# Patient Record
Sex: Male | Born: 1990 | Race: White | Hispanic: No | Marital: Single | Smoking: Former smoker
Health system: Southern US, Community
[De-identification: ages and names within clinical notes are randomized; demographics above are authoritative.]

## PROBLEM LIST (undated history)

## (undated) HISTORY — PX: WISDOM TOOTH EXTRACTION: SHX21

---

## 2014-03-23 ENCOUNTER — Ambulatory Visit (INDEPENDENT_AMBULATORY_CARE_PROVIDER_SITE_OTHER): Payer: Self-pay | Admitting: Family Medicine

## 2014-03-23 VITALS — BP 119/75 | HR 65 | Temp 98.2°F | Ht 69.0 in | Wt 206.0 lb

## 2014-03-23 DIAGNOSIS — L03012 Cellulitis of left finger: Secondary | ICD-10-CM

## 2014-03-23 MED ORDER — CEFTRIAXONE SODIUM 1 G IJ SOLR: 1.0000 g | Freq: Once | INTRAMUSCULAR | Status: DC

## 2014-03-23 MED ORDER — CEFTRIAXONE SODIUM 1 G IJ SOLR
1.0000 g | Freq: Once | INTRAMUSCULAR | Status: AC
  Administered 2014-03-23: 1 g via INTRAMUSCULAR

## 2014-03-23 MED ORDER — HYDROCODONE-ACETAMINOPHEN 5-325 MG PO TABS: 1.0000 | ORAL_TABLET | Freq: Four times a day (QID) | ORAL | Status: DC | PRN

## 2014-03-23 MED ORDER — LEVOFLOXACIN 500 MG PO TABS: 500.0000 mg | ORAL_TABLET | Freq: Every day | ORAL | Status: DC

## 2014-03-23 NOTE — Progress Notes (Signed)
   Subjective:    Patient ID: Jonathon Wright, male    DOB: January 03, 1991, 23 y.o.   MRN: [id number]  HPI Patient is here for c/o left hand pain and infection.  Patient injured hand and this morning it swelled up and is red and painful.   Review of Systems C/o left hand redness and swelling.   No chest pain, SOB, HA, dizziness, vision change, N/V, diarrhea, constipation, dysuria, urinary urgency or frequency, myalgias, arthralgias or rash.  Objective:   Physical Exam Vital signs noted  Well developed well nourished male.  HEENT - Head atraumatic Normocephalic Respiratory - Lungs CTA bilateral Cardiac - RRR S1 and S2 without murmur GI - Abdomen soft Nontender and bowel sounds active x 4 Extremities - No edema. Neuro - Grossly intact. Skin - Right hand erythematous and right index finer with erythema and wound w/o drainage.      Assessment & Plan:  Cellulitis of finger of left hand - Plan: cefTRIAXone (ROCEPHIN) 1 G injection, levofloxacin (LEVAQUIN) 500 MG tablet, HYDROcodone-acetaminophen (NORCO) 5-325 MG per tablet  Discussed with patient to follow up if not getting better  Note for light duty given.  Deatra CanterWilliam J Monita Swier FNP

## 2014-07-03 ENCOUNTER — Emergency Department (HOSPITAL_COMMUNITY)
Admission: EM | Admit: 2014-07-03 | Discharge: 2014-07-04 | Disposition: A | Discharge status: Home / Self Care | Payer: Self-pay | Attending: Emergency Medicine | Admitting: Emergency Medicine

## 2014-07-03 ENCOUNTER — Encounter (HOSPITAL_COMMUNITY): Payer: Self-pay | Admitting: Emergency Medicine

## 2014-07-03 DIAGNOSIS — Z7952 Long term (current) use of systemic steroids: Secondary | ICD-10-CM | POA: Insufficient documentation

## 2014-07-03 DIAGNOSIS — W57XXXA Bitten or stung by nonvenomous insect and other nonvenomous arthropods, initial encounter: Secondary | ICD-10-CM | POA: Insufficient documentation

## 2014-07-03 DIAGNOSIS — Z79899 Other long term (current) drug therapy: Secondary | ICD-10-CM | POA: Insufficient documentation

## 2014-07-03 DIAGNOSIS — Z720 Tobacco use: Secondary | ICD-10-CM | POA: Insufficient documentation

## 2014-07-03 DIAGNOSIS — L089 Local infection of the skin and subcutaneous tissue, unspecified: Secondary | ICD-10-CM

## 2014-07-03 DIAGNOSIS — Y998 Other external cause status: Secondary | ICD-10-CM | POA: Insufficient documentation

## 2014-07-03 DIAGNOSIS — L0889 Other specified local infections of the skin and subcutaneous tissue: Secondary | ICD-10-CM | POA: Insufficient documentation

## 2014-07-03 DIAGNOSIS — Y9389 Activity, other specified: Secondary | ICD-10-CM | POA: Insufficient documentation

## 2014-07-03 DIAGNOSIS — Z792 Long term (current) use of antibiotics: Secondary | ICD-10-CM | POA: Insufficient documentation

## 2014-07-03 DIAGNOSIS — Y9289 Other specified places as the place of occurrence of the external cause: Secondary | ICD-10-CM | POA: Insufficient documentation

## 2014-07-03 DIAGNOSIS — S60562A Insect bite (nonvenomous) of left hand, initial encounter: Secondary | ICD-10-CM | POA: Insufficient documentation

## 2014-07-03 MED ORDER — DIPHENHYDRAMINE HCL 50 MG/ML IJ SOLN
25.0000 mg | Freq: Once | INTRAMUSCULAR | Status: AC
Start: 2014-07-03 — End: 2014-07-03
  Administered 2014-07-03: 25 mg via INTRAVENOUS
  Filled 2014-07-03: qty 1

## 2014-07-03 MED ORDER — FAMOTIDINE IN NACL 20-0.9 MG/50ML-% IV SOLN
20.0000 mg | Freq: Once | INTRAVENOUS | Status: AC
  Administered 2014-07-03: 20 mg via INTRAVENOUS
  Filled 2014-07-03: qty 50

## 2014-07-03 MED ORDER — METHYLPREDNISOLONE SODIUM SUCC 125 MG IJ SOLR
125.0000 mg | Freq: Once | INTRAMUSCULAR | Status: AC
  Administered 2014-07-03: 125 mg via INTRAVENOUS
  Filled 2014-07-03: qty 2

## 2014-07-03 NOTE — ED Provider Notes (Signed)
CSN: [id number]     Arrival date & time 07/03/14  2258 History   First MD Initiated Contact with Patient 07/03/14 2326     Chief Complaint  Patient presents with  . Joint Swelling     (Consider location/radiation/quality/duration/timing/severity/associated sxs/prior Treatment) Patient is a 24 y.o. male presenting with hand pain. The history is provided by the patient.  Hand Pain This is a new problem. The current episode started today. The problem occurs constantly. The problem has been gradually worsening. Nothing aggravates the symptoms. He has tried nothing for the symptoms.   Jonathon Wright is a 24 y.o. male who presents to the ED with left hand pain and swelling. He thinks he may have been bitten by an insect. He noticed the possible bite area last night. 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 He complains of increased pain to the area of the index finger.   History reviewed. No pertinent past medical history. History reviewed. No pertinent past surgical history. No family history on file. History  Substance Use Topics  . Smoking status: Current Every Day Smoker -- 1.00 packs/day for 8 years    Types: Cigarettes  . Smokeless tobacco: Not on file  . Alcohol Use: 0.0 oz/week    0 Standard drinks or equivalent per week    Review of Systems Negative except as stated in HPI   Allergies  Review of patient's allergies indicates no known allergies.  Home Medications   Prior to Admission medications   Medication Sig Start Date End Date Taking? Authorizing Provider  cephALEXin (KEFLEX) 500 MG capsule Take 1 capsule (500 mg total) by mouth 4 (four) times daily. 07/04/14   Tamlyn Sides Orlene Och, NP  diphenhydrAMINE (BENADRYL) 25 MG tablet Take 1 tablet (25 mg total) by mouth every 6 (six) hours. 07/04/14   Shylie Polo Orlene Och, NP  HYDROcodone-acetaminophen (NORCO) 5-325 MG per tablet Take 1 tablet by mouth every 6 (six) hours as needed for moderate pain. 07/04/14   Truman Aceituno Orlene Och,  NP  levofloxacin (LEVAQUIN) 500 MG tablet Take 1 tablet (500 mg total) by mouth daily. 03/23/14   Deatra Canter, FNP  predniSONE (DELTASONE) 10 MG tablet Take 2 tablets (20 mg total) by mouth 2 (two) times daily with a meal. 07/04/14   Eldred Lievanos Orlene Och, NP  ranitidine (ZANTAC) 150 MG tablet Take 1 tablet (150 mg total) by mouth 2 (two) times daily. 07/04/14   Leshaun Biebel Orlene Och, NP  sulfamethoxazole-trimethoprim (BACTRIM DS,SEPTRA DS) 800-160 MG per tablet Take 1 tablet by mouth 2 (two) times daily. 07/04/14 07/11/14  Arneisha Kincannon Orlene Och, NP   BP 125/74 mmHg  Pulse 83  Temp(Src) 98 F (36.7 C) (Oral)  Resp 18  Ht  (1.753 m)  Wt 197 lb (89.359 kg)  BMI 29.08 kg/m2  SpO2 100% Physical Exam  Constitutional: He is oriented to person, place, and time. He appears well-developed and well-nourished. No distress.  HENT:  Head: Normocephalic and atraumatic.  Eyes: EOM are normal.  Neck: Neck supple.  Cardiovascular: Normal rate.   Pulmonary/Chest: Effort normal.  Musculoskeletal:       Hands: Area of erythema and warmth dorsum of left hand. Tiny area that appears as insect bite.   Neurological: He is alert and oriented to person, place, and time. No cranial nerve deficit.  Skin: Skin is warm and dry.  Psychiatric: He has a normal mood and affect. His behavior is normal.  Nursing note and vitals reviewed.  ED Course  Procedures ( Pepcid 20 mg., Benadryl 25 mg, Solumedrol 125 mg IV, Dilaudid 1 mg, Zofran 4 mg IV After no change in redness Rocephin 1 gram IV and Bactrim DS PO given.   Dr. Lynelle DoctorKnapp in to examine the patient and take picture for the chart. Will continue to treat with oral antibiotics and medications for allergic reaction and patient will return tomorrow for recheck.   MDM  24 y.o. male with cellulitis of left hand. Stable for d/c without neurovascular compromise, no red streaking. He will return tomorrow for recheck or sooner for problems. Will continue to treat with oral antibiotics and  medications for allergic reaction.  Final diagnoses:  Infected insect bite of hand, left, initial encounter      [facility] Kaisha Wachob, NP 07/04/14 [id number]  Devoria AlbeIva Knapp, MD 07/04/14 [phone]

## 2014-07-03 NOTE — ED Notes (Signed)
Patient c/o left hand swelling and redness at proximal joint of index finger; states he thinks he may have been bitten by something.  Left hand red and edematous.

## 2014-07-04 MED ORDER — HYDROMORPHONE HCL 1 MG/ML IJ SOLN
1.0000 mg | Freq: Once | INTRAMUSCULAR | Status: AC
  Administered 2014-07-04: 1 mg via INTRAVENOUS
  Filled 2014-07-04: qty 1

## 2014-07-04 MED ORDER — DIPHENHYDRAMINE HCL 25 MG PO TABS: 25.0000 mg | ORAL_TABLET | Freq: Four times a day (QID) | ORAL | Status: DC

## 2014-07-04 MED ORDER — ONDANSETRON HCL 4 MG/2ML IJ SOLN
4.0000 mg | Freq: Once | INTRAMUSCULAR | Status: AC
Start: 2014-07-04 — End: 2014-07-04
  Administered 2014-07-04: 4 mg via INTRAVENOUS
  Filled 2014-07-04: qty 2

## 2014-07-04 MED ORDER — PREDNISONE 10 MG PO TABS: 20.0000 mg | ORAL_TABLET | Freq: Two times a day (BID) | ORAL | Status: DC

## 2014-07-04 MED ORDER — CEPHALEXIN 500 MG PO CAPS: 500.0000 mg | ORAL_CAPSULE | Freq: Four times a day (QID) | ORAL | Status: DC

## 2014-07-04 MED ORDER — CEFTRIAXONE SODIUM 250 MG IJ SOLR
250.0000 mg | Freq: Once | INTRAMUSCULAR | Status: AC
  Administered 2014-07-04: 250 mg via INTRAMUSCULAR
  Filled 2014-07-04: qty 250

## 2014-07-04 MED ORDER — LIDOCAINE HCL (PF) 1 % IJ SOLN
INTRAMUSCULAR | Status: AC
  Filled 2014-07-04: qty 5

## 2014-07-04 MED ORDER — HYDROCODONE-ACETAMINOPHEN 5-325 MG PO TABS: 1.0000 | ORAL_TABLET | Freq: Four times a day (QID) | ORAL | Status: DC | PRN

## 2014-07-04 MED ORDER — SULFAMETHOXAZOLE-TRIMETHOPRIM 800-160 MG PO TABS: 1.0000 | ORAL_TABLET | Freq: Two times a day (BID) | ORAL | Status: AC

## 2014-07-04 MED ORDER — RANITIDINE HCL 150 MG PO TABS: 150.0000 mg | ORAL_TABLET | Freq: Two times a day (BID) | ORAL | Status: DC

## 2014-07-04 MED ORDER — SULFAMETHOXAZOLE-TRIMETHOPRIM 800-160 MG PO TABS
1.0000 | ORAL_TABLET | Freq: Once | ORAL | Status: AC
  Administered 2014-07-04: 1 via ORAL
  Filled 2014-07-04: qty 1

## 2014-07-04 NOTE — ED Notes (Signed)
Redness to the left hand marked and informed pt if area gets outside of lines he needs to be reevaluated.

## 2014-07-04 NOTE — ED Provider Notes (Signed)
Pt states he didn't do anything on 4/9 but noticed a bite site near the MCP of his LIF when he went to bed. 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      Pt has an apparent sting site on the ulnar aspect of his LIF MCPJ with mild diffuse redness and swelling of the dorsum of his left hand as shown.        Medical screening examination/treatment/procedure(s) were conducted as a shared visit with non-physician practitioner(s) and myself.  I personally evaluated the patient during the encounter.   EKG Interpretation None       Devoria AlbeIva [place], MD, Concha PyoFACEP   Kamrin Spath, MD 07/04/14 [phone]

## 2014-07-06 ENCOUNTER — Encounter (HOSPITAL_COMMUNITY): Payer: Self-pay | Admitting: *Deleted

## 2014-07-06 ENCOUNTER — Emergency Department (HOSPITAL_COMMUNITY)
Admission: EM | Admit: 2014-07-06 | Discharge: 2014-07-06 | Disposition: A | Discharge status: Home / Self Care | Payer: Self-pay | Attending: Emergency Medicine | Admitting: Emergency Medicine

## 2014-07-06 DIAGNOSIS — Z7952 Long term (current) use of systemic steroids: Secondary | ICD-10-CM | POA: Insufficient documentation

## 2014-07-06 DIAGNOSIS — Z792 Long term (current) use of antibiotics: Secondary | ICD-10-CM | POA: Insufficient documentation

## 2014-07-06 DIAGNOSIS — S60562D Insect bite (nonvenomous) of left hand, subsequent encounter: Secondary | ICD-10-CM | POA: Insufficient documentation

## 2014-07-06 DIAGNOSIS — W57XXXD Bitten or stung by nonvenomous insect and other nonvenomous arthropods, subsequent encounter: Secondary | ICD-10-CM | POA: Insufficient documentation

## 2014-07-06 DIAGNOSIS — W57XXXA Bitten or stung by nonvenomous insect and other nonvenomous arthropods, initial encounter: Secondary | ICD-10-CM

## 2014-07-06 DIAGNOSIS — L089 Local infection of the skin and subcutaneous tissue, unspecified: Secondary | ICD-10-CM | POA: Insufficient documentation

## 2014-07-06 DIAGNOSIS — Z720 Tobacco use: Secondary | ICD-10-CM | POA: Insufficient documentation

## 2014-07-06 MED ORDER — OXYCODONE-ACETAMINOPHEN 5-325 MG PO TABS: 1.0000 | ORAL_TABLET | ORAL | Status: DC | PRN

## 2014-07-06 NOTE — Discharge Instructions (Signed)
Insect Bite Mosquitoes, flies, fleas, bedbugs, and other insects can bite. Insect bites are different from insect stings. The bite may be red, puffy (swollen), and itchy for 2 to 4 days. Most bites get better on their own. HOME CARE   Do not scratch the bite.  Keep the bite clean and dry. Wash the bite with soap and water.  Put ice on the bite.  Put ice in a plastic bag.  Place a towel between your skin and the bag.  Leave the ice on for 20 minutes, 4 times a day. Do this for the first 2 to 3 days, or as told by your doctor.  You may use medicated lotions or creams to lessen itching as told by your doctor.  Only take medicines as told by your doctor.  If you are given medicines (antibiotics), take them as told. Finish them even if you start to feel better. You may need a tetanus shot if:  You cannot remember when you had your last tetanus shot.  You have never had a tetanus shot.  The injury broke your skin. If you need a tetanus shot and you choose not to have one, you may get tetanus. Sickness from tetanus can be serious. GET HELP RIGHT AWAY IF:   You have more pain, redness, or puffiness.  You see a red line on the skin coming from the bite.  You have a fever.  You have joint pain.  You have a headache or neck pain.  You feel weak.  You have a rash.  You have chest pain, or you are short of breath.  You have belly (abdominal) pain.  You feel sick to your stomach (nauseous) or throw up (vomit).  You feel very tired or sleepy. MAKE SURE YOU:   Understand these instructions.  Will watch your condition.  Will get help right away if you are not doing well or get worse. Document Released: 03/08/2000 Document Revised: 06/03/2011 Document Reviewed: 10/10/2010 Exit[employer] Patient Information 2015 [employer]. This information is not intended to replace advice given to you by your health care provider. Make sure you discuss any questions you have with your health  care provider.  

## 2014-07-06 NOTE — ED Notes (Signed)
Seen here 4 /10 for redness and swelling of lt hand,  Was supposed to return 4/11, but did not Here for recheck , says it cont to hurt

## 2014-07-06 NOTE — ED Notes (Signed)
Pt verbalized understanding of no driving and to use caution within 4 hours of taking pain meds due to meds cause drowsiness 

## 2014-07-07 NOTE — ED Provider Notes (Signed)
CSN: [id number]     Arrival date & time 07/06/14  1106 History   First MD Initiated Contact with Patient 07/06/14 1205     Chief Complaint  Patient presents with  . Hand Problem     (Consider location/radiation/quality/duration/timing/severity/associated sxs/prior Treatment) HPI   Jonathon Wright is a 24 y.o. male who presents to the Emergency Department requesting recheck of infected insect bite to the left hand.  He was seen here on 4/10 and treated with antibiotics.  He states the pain and redness to the area has improved, but reports continued swelling.  He states he continues to have mobility of his fingers,  He denies fever, chills, radiating pain, numbness or swelling to his fingers.     History reviewed. No pertinent past medical history. History reviewed. No pertinent past surgical history. History reviewed. No pertinent family history. History  Substance Use Topics  . Smoking status: Current Every Day Smoker -- 1.00 packs/day for 8 years    Types: Cigarettes  . Smokeless tobacco: Not on file  . Alcohol Use: 0.0 oz/week    0 Standard drinks or equivalent per week    Review of Systems  Constitutional: Negative for fever and chills.  Gastrointestinal: Negative for nausea and vomiting.  Musculoskeletal: Positive for myalgias. Negative for joint swelling and arthralgias.  Skin: Negative for color change and wound.       Insect bite to left hand  Neurological: Negative for dizziness, weakness and numbness.  All other systems reviewed and are negative.     Allergies  Review of patient's allergies indicates no known allergies.  Home Medications   Prior to Admission medications   Medication Sig Start Date End Date Taking? Authorizing Provider  cephALEXin (KEFLEX) 500 MG capsule Take 1 capsule (500 mg total) by mouth 4 (four) times daily. 07/04/14  Yes Hope Orlene Och, NP  diphenhydrAMINE (BENADRYL) 25 MG tablet Take 1 tablet (25 mg total) by mouth every 6 (six) hours.  07/04/14  Yes Hope Orlene Och, NP  HYDROcodone-acetaminophen (NORCO) 5-325 MG per tablet Take 1 tablet by mouth every 6 (six) hours as needed for moderate pain. 07/04/14  Yes Hope Orlene Och, NP  predniSONE (DELTASONE) 10 MG tablet Take 2 tablets (20 mg total) by mouth 2 (two) times daily with a meal. 07/04/14  Yes Hope Orlene Och, NP  ranitidine (ZANTAC) 150 MG tablet Take 1 tablet (150 mg total) by mouth 2 (two) times daily. 07/04/14  Yes Hope Orlene Och, NP  sulfamethoxazole-trimethoprim (BACTRIM DS,SEPTRA DS) 800-160 MG per tablet Take 1 tablet by mouth 2 (two) times daily. 07/04/14 07/11/14 Yes Hope Orlene Och, NP  levofloxacin (LEVAQUIN) 500 MG tablet Take 1 tablet (500 mg total) by mouth daily. Patient not taking: Reported on 07/06/2014 03/23/14   Deatra Canter, FNP  oxyCODONE-acetaminophen (PERCOCET/ROXICET) 5-325 MG per tablet Take 1 tablet by mouth every 4 (four) hours as needed. 07/06/14   Kayleen Alig, PA-C   BP 119/73 mmHg  Pulse 58  Temp(Src) 98.4 F (36.9 C) (Oral)  Resp 18  Ht  (1.753 m)  Wt 197 lb (89.359 kg)  BMI 29.08 kg/m2  SpO2 99% Physical Exam  Constitutional: He is oriented to person, place, and time. He appears well-developed and well-nourished. No distress.  HENT:  Head: Normocephalic and atraumatic.  Neck: Normal range of motion. Neck supple.  Cardiovascular: Normal rate, regular rhythm and intact distal pulses.   No murmur heard. Pulmonary/Chest: Effort normal and breath sounds normal. No respiratory distress.  Musculoskeletal: He exhibits edema and tenderness.  Neurological: He is alert and oriented to person, place, and time. He exhibits normal muscle tone. Coordination normal.  Skin: Skin is warm and dry.  Mild edema of the dorsal left hand.  Erythema improved.  Small crusted papule to the dorsal aspect of the hand, near the second metacarpal head.  No fluctuance or drainage.  Psychiatric: He has a normal mood and affect.  Nursing note and vitals reviewed.   ED  Course  Procedures (including critical care time) Labs Review Labs Reviewed - No data to display  Imaging Review No results found.   EKG Interpretation None      MDM   Final diagnoses:  Infected insect bite   Pt was seen here on 07/03/14 and treated for infected insect bite.  Leading edge of the erythema was previously marked and on this visit seems to be improving,  He is NV intact, has full ROM of the digits and is currently taking antibiotics,  No abscess,  Agrees to continue current regimen and advised to return if worsening sx's develop.      Rosey Bathammy Innocence Schlotzhauer, PA-C 07/07/14 2155  Raeford RazorStephen Kohut, MD 07/08/14 1630

## 2014-09-20 ENCOUNTER — Encounter (HOSPITAL_COMMUNITY): Payer: Self-pay | Admitting: Emergency Medicine

## 2014-09-20 ENCOUNTER — Emergency Department (HOSPITAL_COMMUNITY)
Admission: EM | Admit: 2014-09-20 | Discharge: 2014-09-20 | Disposition: A | Discharge status: Home / Self Care | Payer: Self-pay | Attending: Emergency Medicine | Admitting: Emergency Medicine

## 2014-09-20 DIAGNOSIS — Y9389 Activity, other specified: Secondary | ICD-10-CM | POA: Insufficient documentation

## 2014-09-20 DIAGNOSIS — X58XXXA Exposure to other specified factors, initial encounter: Secondary | ICD-10-CM | POA: Insufficient documentation

## 2014-09-20 DIAGNOSIS — Z792 Long term (current) use of antibiotics: Secondary | ICD-10-CM | POA: Insufficient documentation

## 2014-09-20 DIAGNOSIS — Z79899 Other long term (current) drug therapy: Secondary | ICD-10-CM | POA: Insufficient documentation

## 2014-09-20 DIAGNOSIS — Z7952 Long term (current) use of systemic steroids: Secondary | ICD-10-CM | POA: Insufficient documentation

## 2014-09-20 DIAGNOSIS — Y998 Other external cause status: Secondary | ICD-10-CM | POA: Insufficient documentation

## 2014-09-20 DIAGNOSIS — S39012A Strain of muscle, fascia and tendon of lower back, initial encounter: Secondary | ICD-10-CM | POA: Insufficient documentation

## 2014-09-20 DIAGNOSIS — Y9289 Other specified places as the place of occurrence of the external cause: Secondary | ICD-10-CM | POA: Insufficient documentation

## 2014-09-20 DIAGNOSIS — Z720 Tobacco use: Secondary | ICD-10-CM | POA: Insufficient documentation

## 2014-09-20 MED ORDER — METHOCARBAMOL 500 MG PO TABS: 1000.0000 mg | ORAL_TABLET | Freq: Three times a day (TID) | ORAL | Status: DC

## 2014-09-20 MED ORDER — IBUPROFEN 800 MG PO TABS: 800.0000 mg | ORAL_TABLET | Freq: Three times a day (TID) | ORAL | Status: DC

## 2014-09-20 MED ORDER — DEXAMETHASONE 4 MG PO TABS: 4.0000 mg | ORAL_TABLET | Freq: Two times a day (BID) | ORAL | Status: DC

## 2014-09-20 NOTE — ED Notes (Signed)
Pt reports low back pain that began on Sunday after splitting logs on Sat. Pt reports radiation of symptoms down his R leg.

## 2014-09-20 NOTE — ED Provider Notes (Signed)
CSN: [id number]     Arrival date & time 09/20/14  0818 History   First MD Initiated Contact with Patient 09/20/14 [phone]     Chief Complaint  Patient presents with  . Back Pain     (Consider location/radiation/quality/duration/timing/severity/associated sxs/prior Treatment) Patient is a 24 y.o. male presenting with back pain. The history is provided by the patient.  Back Pain Location:  Lumbar spine Quality:  Aching and stiffness Stiffness is present:  All day Radiates to:  R thigh Pain severity:  Moderate Pain is:  Same all the time Onset quality:  Gradual Duration:  3 days Timing:  Intermittent Progression:  Worsening Chronicity:  New Context: lifting heavy objects and physical stress   Context comment:  Splitting logs Relieved by:  Nothing Worsened by:  Movement Ineffective treatments:  None tried Associated symptoms: no bladder incontinence, no bowel incontinence, no paresthesias, no perianal numbness and no weakness   Risk factors: no recent surgery     History reviewed. No pertinent past medical history. History reviewed. No pertinent past surgical history. History reviewed. No pertinent family history. History  Substance Use Topics  . Smoking status: Current Every Day Smoker -- 1.00 packs/day for 8 years    Types: Cigarettes  . Smokeless tobacco: Not on file  . Alcohol Use: 0.0 oz/week    0 Standard drinks or equivalent per week    Review of Systems  Gastrointestinal: Negative for bowel incontinence.  Genitourinary: Negative for bladder incontinence.  Musculoskeletal: Positive for back pain.  Neurological: Negative for weakness and paresthesias.  All other systems reviewed and are negative.     Allergies  Review of patient's allergies indicates no known allergies.  Home Medications   Prior to Admission medications   Medication Sig Start Date End Date Taking? Authorizing Provider  cephALEXin (KEFLEX) 500 MG capsule Take 1 capsule (500 mg total) by  mouth 4 (four) times daily. 07/04/14   Hope Orlene OchM Neese, NP  diphenhydrAMINE (BENADRYL) 25 MG tablet Take 1 tablet (25 mg total) by mouth every 6 (six) hours. 07/04/14   Hope Orlene OchM Neese, NP  HYDROcodone-acetaminophen (NORCO) 5-325 MG per tablet Take 1 tablet by mouth every 6 (six) hours as needed for moderate pain. 07/04/14   Hope Orlene OchM Neese, NP  levofloxacin (LEVAQUIN) 500 MG tablet Take 1 tablet (500 mg total) by mouth daily. Patient not taking: Reported on 07/06/2014 03/23/14   Deatra CanterWilliam J Oxford, FNP  oxyCODONE-acetaminophen (PERCOCET/ROXICET) 5-325 MG per tablet Take 1 tablet by mouth every 4 (four) hours as needed. 07/06/14   Tammy Triplett, PA-C  predniSONE (DELTASONE) 10 MG tablet Take 2 tablets (20 mg total) by mouth 2 (two) times daily with a meal. 07/04/14   Hope Orlene OchM Neese, NP  ranitidine (ZANTAC) 150 MG tablet Take 1 tablet (150 mg total) by mouth 2 (two) times daily. 07/04/14   Hope Orlene OchM Neese, NP   BP 127/79 mmHg  Pulse 66  Temp(Src) 98.2 F (36.8 C) (Oral)  Resp 18  Ht 5\' 8"  (1.727 m)  Wt 190 lb (86.183 kg)  BMI 28.90 kg/m2  SpO2 100% Physical Exam  Constitutional: He is oriented to person, place, and time. He appears well-developed and well-nourished.  Non-toxic appearance.  HENT:  Head: Normocephalic.  Right Ear: Tympanic membrane and external ear normal.  Left Ear: Tympanic membrane and external ear normal.  Eyes: EOM and lids are normal. Pupils are equal, round, and reactive to light.  Neck: Normal range of motion. Neck supple. Carotid bruit is not present.  Cardiovascular: Normal rate, regular rhythm, normal heart sounds, intact distal pulses and normal pulses.   Pulmonary/Chest: Breath sounds normal. No respiratory distress.  Abdominal: Soft. Bowel sounds are normal. There is no tenderness. There is no guarding.  Musculoskeletal:       Lumbar back: He exhibits decreased range of motion, tenderness and pain. He exhibits no deformity.       Back:  Lymphadenopathy:       Head (right  side): No submandibular adenopathy present.       Head (left side): No submandibular adenopathy present.    He has no cervical adenopathy.  Neurological: He is alert and oriented to person, place, and time. He has normal strength. No cranial nerve deficit or sensory deficit.  Gait steady. No foot drop. No sensory deficit. No motor deficit.  Skin: Skin is warm and dry.  Psychiatric: He has a normal mood and affect. His speech is normal.  Nursing note and vitals reviewed.   ED Course  Procedures (including critical care time) Labs Review Labs Reviewed - No data to display  Imaging Review No results found.   EKG Interpretation None      MDM  Vital signs stable. Exam suggest lumbar strain. No gross neuro deficit noted. No evidence for cauda equina. Pt to use heating pad, rest his back, use decadron, ibuprofen, and robaxin as prescribed. Pt to follow up with Dr Shon Baton for orthopedic eval of back pain if not improving.   Final diagnoses:  None    *I have reviewed nursing notes, vital signs, and all appropriate lab and imaging results for this patient.[address] PA-C 09/20/14 [id number]  Vanetta Mulders, MD 09/20/14 1630

## 2014-09-20 NOTE — Discharge Instructions (Signed)
Heating pad to the lower back may be helpful. Please rest your back as much as possible. Use robaxin and ibuprofen three times daily with food. Use decadron 2 times daily until all taken. Please see Dr Shon BatonBrooks for additional evaluation if not improving. Lumbosacral Strain Lumbosacral strain is a strain of any of the parts that make up your lumbosacral vertebrae. Your lumbosacral vertebrae are the bones that make up the lower third of your backbone. Your lumbosacral vertebrae are held together by muscles and tough, fibrous tissue (ligaments).  CAUSES  A sudden blow to your back can cause lumbosacral strain. Also, anything that causes an excessive stretch of the muscles in the low back can cause this strain. This is typically seen when people exert themselves strenuously, fall, lift heavy objects, bend, or crouch repeatedly. RISK FACTORS  Physically demanding work.  Participation in pushing or pulling sports or sports that require a sudden twist of the back (tennis, golf, baseball).  Weight lifting.  Excessive lower back curvature.  Forward-tilted pelvis.  Weak back or abdominal muscles or both.  Tight hamstrings. SIGNS AND SYMPTOMS  Lumbosacral strain may cause pain in the area of your injury or pain that moves (radiates) down your leg.  DIAGNOSIS Your health care provider can often diagnose lumbosacral strain through a physical exam. In some cases, you may need tests such as X-ray exams.  TREATMENT  Treatment for your lower back injury depends on many factors that your clinician will have to evaluate. However, most treatment will include the use of anti-inflammatory medicines. HOME CARE INSTRUCTIONS   Avoid hard physical activities (tennis, racquetball, waterskiing) if you are not in proper physical condition for it. This may aggravate or create problems.  If you have a back problem, avoid sports requiring sudden body movements. Swimming and walking are generally safer  activities.  Maintain good posture.  Maintain a healthy weight.  For acute conditions, you may put ice on the injured area.  Put ice in a plastic bag.  Place a towel between your skin and the bag.  Leave the ice on for 20 minutes, 2-3 times a day.  When the low back starts healing, stretching and strengthening exercises may be recommended. SEEK MEDICAL CARE IF:  Your back pain is getting worse.  You experience severe back pain not relieved with medicines. SEEK IMMEDIATE MEDICAL CARE IF:   You have numbness, tingling, weakness, or problems with the use of your arms or legs.  There is a change in bowel or bladder control.  You have increasing pain in any area of the body, including your belly (abdomen).  You notice shortness of breath, dizziness, or feel faint.  You feel sick to your stomach (nauseous), are throwing up (vomiting), or become sweaty.  You notice discoloration of your toes or legs, or your feet get very cold. MAKE SURE YOU:   Understand these instructions.  Will watch your condition.  Will get help right away if you are not doing well or get worse. Document Released: 12/19/2004 Document Revised: 03/16/2013 Document Reviewed: 10/28/2012 [facility] Patient Information 2015 [employer], [place]. This information is not intended to replace advice given to you by your health care provider. Make sure you discuss any questions you have with your health care provider.

## 2014-11-14 ENCOUNTER — Emergency Department (HOSPITAL_COMMUNITY)
Admission: EM | Admit: 2014-11-14 | Discharge: 2014-11-14 | Disposition: A | Discharge status: Home / Self Care | Payer: Self-pay | Attending: Emergency Medicine | Admitting: Emergency Medicine

## 2014-11-14 ENCOUNTER — Encounter (HOSPITAL_COMMUNITY): Payer: Self-pay | Admitting: *Deleted

## 2014-11-14 DIAGNOSIS — K047 Periapical abscess without sinus: Secondary | ICD-10-CM | POA: Insufficient documentation

## 2014-11-14 DIAGNOSIS — Z720 Tobacco use: Secondary | ICD-10-CM | POA: Insufficient documentation

## 2014-11-14 DIAGNOSIS — K029 Dental caries, unspecified: Secondary | ICD-10-CM | POA: Insufficient documentation

## 2014-11-14 MED ORDER — TRAMADOL HCL 50 MG PO TABS: 50.0000 mg | ORAL_TABLET | Freq: Four times a day (QID) | ORAL | Status: DC | PRN

## 2014-11-14 MED ORDER — CLINDAMYCIN HCL 150 MG PO CAPS: 300.0000 mg | ORAL_CAPSULE | Freq: Four times a day (QID) | ORAL | Status: DC

## 2014-11-14 NOTE — Discharge Instructions (Signed)
Periodontal Disease °Periodontal disease, or gum disease, is a type of oral disease that affects the surrounding and supporting tissues of the teeth. These include the gums (gingivae), ligaments, and tooth socket (alveolar bone). Periodontal disease can affect one tooth or many teeth. If left untreated, it may lead to tooth loss.  °CAUSES °The main cause of periodontal disease is dental plaque, which contains harmful bacteria. These bacteria can cause the gums to become inflamed and infected. Further progression of the disease can damage the other supporting tissues.  °RISK FACTORS °· Diabetes.   °· Smoking and tobacco use.   °· Genetics.   °· Hormonal changes of puberty, menopause, and pregnancy.   °· Stress.   °· Clenching or grinding your teeth.   °· Substance abuse. °· Poor nutrition.   °· Diseases that interfere with the body's immune system.   °· Certain medicines. °SIGNS AND SYMPTOMS °· Red or swollen gums. °· Bad breath that does not go away. °· Gums that have pulled away from the teeth. °· Gums that bleed easily. °· Permanent teeth that are loose or separating. °· Pain when chewing. °· Changes in the way your teeth fit together. °· Sensitive teeth. °DIAGNOSIS  °A thorough examination of the periodontal tissues will be done by your dentist. X-rays may be needed. Evaluation of your medical history will be needed to see if there are other factors or underlying conditions that may contribute to the disease. °TREATMENT °The number and types of treatment will vary depending on the extent of the disease. Treatment may include brushing and flossing only. Further disease progression may necessitate scaling and root planing or even surgery. The main goal is to control the infection. Good oral hygiene at home is necessary for the success of all types of treatment. °HOME CARE INSTRUCTIONS  °· Practice good oral hygiene. This includes flossing and brushing your teeth every day.   °· See your dentist regularly, at least  2 times per year.   °· Stop smoking if you smoke. °· Eat a well-balanced diet. °SEEK IMMEDIATE DENTAL CARE IF:  °· You have any signs or symptoms of periodontal disease along with: °¨ Swelling of your face, neck, or jaw. °¨ Inability to open your mouth. °¨ Severe pain uncontrolled by pain medicine. °· You have a fever or persistent symptoms for more than 2-3 days. °· You have a fever and your symptoms suddenly get worse. °Document Released: 03/14/2003 Document Revised: 11/11/2012 Document Reviewed: 08/18/2012 °[employer]® Patient Information ©2015 [employer]. This information is not intended to replace advice given to you by your health care provider. Make sure you discuss any questions you have with your health care provider. ° °

## 2014-11-14 NOTE — ED Provider Notes (Signed)
CSN: [id number]     Arrival date & time 11/14/14  4098 History   First MD Initiated Contact with Patient 11/14/14 1004     Chief Complaint  Patient presents with  . Facial Swelling     (Consider location/radiation/quality/duration/timing/severity/associated sxs/prior Treatment) HPI   Jonathon Wright is a 24 y.o. male who presents to the Emergency Department complaining of dental pain, and feeling as though his upper lip and bottom of his nose is swelling. He states he woke up this morning with the symptoms. Dental pain has been progressing for several days. He has not taken any medications for symptoms. He denies difficulty swallowing or breathing, shortness of breath, swelling of his tongue, fever, sore throat or pain to his face.  Pain is worse with chewing.   History reviewed. No pertinent past medical history. History reviewed. No pertinent past surgical history. History reviewed. No pertinent family history. Social History  Substance Use Topics  . Smoking status: Current Every Day Smoker -- 1.00 packs/day for 8 years    Types: Cigarettes  . Smokeless tobacco: None  . Alcohol Use: 0.0 oz/week    0 Standard drinks or equivalent per week    Review of Systems  Constitutional: Negative for fever and appetite change.  HENT: Positive for dental problem and facial swelling. Negative for congestion, sore throat and trouble swallowing.   Eyes: Negative for pain and visual disturbance.  Respiratory: Negative for shortness of breath.   Musculoskeletal: Negative for neck pain and neck stiffness.  Neurological: Negative for dizziness, facial asymmetry and headaches.  Hematological: Negative for adenopathy.  All other systems reviewed and are negative.     Allergies  Review of patient's allergies indicates no known allergies.  Home Medications   Prior to Admission medications   Medication Sig Start Date End Date Taking? Authorizing Provider  cephALEXin (KEFLEX) 500 MG capsule Take  1 capsule (500 mg total) by mouth 4 (four) times daily. Patient not taking: Reported on 11/14/2014 07/04/14   Janne Napoleon, NP  dexamethasone (DECADRON) 4 MG tablet Take 1 tablet (4 mg total) by mouth 2 (two) times daily with a meal. Patient not taking: Reported on 11/14/2014 09/20/14   Ivery Quale, PA-C  diphenhydrAMINE (BENADRYL) 25 MG tablet Take 1 tablet (25 mg total) by mouth every 6 (six) hours. Patient not taking: Reported on 11/14/2014 07/04/14   Janne Napoleon, NP  HYDROcodone-acetaminophen (NORCO) 5-325 MG per tablet Take 1 tablet by mouth every 6 (six) hours as needed for moderate pain. Patient not taking: Reported on 11/14/2014 07/04/14   Janne Napoleon, NP  ibuprofen (ADVIL,MOTRIN) 800 MG tablet Take 1 tablet (800 mg total) by mouth 3 (three) times daily. Patient not taking: Reported on 11/14/2014 09/20/14   Ivery Quale, PA-C  levofloxacin (LEVAQUIN) 500 MG tablet Take 1 tablet (500 mg total) by mouth daily. Patient not taking: Reported on 07/06/2014 03/23/14   Deatra Canter, FNP  methocarbamol (ROBAXIN) 500 MG tablet Take 2 tablets (1,000 mg total) by mouth 3 (three) times daily. Patient not taking: Reported on 11/14/2014 09/20/14   Ivery Quale, PA-C  oxyCODONE-acetaminophen (PERCOCET/ROXICET) 5-325 MG per tablet Take 1 tablet by mouth every 4 (four) hours as needed. Patient not taking: Reported on 11/14/2014 07/06/14   Davione Lenker, PA-C  predniSONE (DELTASONE) 10 MG tablet Take 2 tablets (20 mg total) by mouth 2 (two) times daily with a meal. Patient not taking: Reported on 11/14/2014 07/04/14   Janne Napoleon, NP  ranitidine (ZANTAC) 150  MG tablet Take 1 tablet (150 mg total) by mouth 2 (two) times daily. Patient not taking: Reported on 11/14/2014 07/04/14   Janne Napoleon, NP   BP 114/79 mmHg  Pulse 59  Temp(Src) 98.4 F (36.9 C) (Oral)  Resp 18  SpO2 100% Physical Exam  Constitutional: He is oriented to person, place, and time. He appears well-developed and well-nourished. No distress.   HENT:  Head: Normocephalic and atraumatic.  Right Ear: Tympanic membrane and ear canal normal.  Left Ear: Tympanic membrane and ear canal normal.  Mouth/Throat: Uvula is midline, oropharynx is clear and moist and mucous membranes are normal. No oral lesions. No trismus in the jaw. Dental caries present. No dental abscesses or uvula swelling.  Multiple dental caries with tenderness of the upper central and lateral incisors, mild swelling of the adjacent gums..  No facial swelling, obvious dental abscess, trismus, or sublingual abnml.    Neck: Normal range of motion. Neck supple.  Cardiovascular: Normal rate, regular rhythm and normal heart sounds.   No murmur heard. Pulmonary/Chest: Effort normal and breath sounds normal. No respiratory distress.  Musculoskeletal: Normal range of motion.  Lymphadenopathy:    He has no cervical adenopathy.  Neurological: He is alert and oriented to person, place, and time. He exhibits normal muscle tone. Coordination normal.  Skin: Skin is warm and dry.  Nursing note and vitals reviewed.   ED Course  Procedures (including critical care time) Labs Review Labs Reviewed - No data to display  Imaging Review No results found. I have personally reviewed and evaluated these images and lab results as part of my medical decision-making.   EKG Interpretation None      MDM   Final diagnoses:  None    Pt is well appearing, airway patent.  Vitals stable.  Widespread dental decay and likely developing dental infection.  No obvious edema of the face, no angioedema  Will treat with antibiotics, patient agrees to dental follow-up. Appears stable for discharge.    Pauline Aus, PA-C 11/14/14 1055  Mancel Bale, MD 11/14/14 1539

## 2014-11-14 NOTE — ED Notes (Signed)
Patient reports he woke up this morning with lip and nasal swelling. Denies any new meds, soaps, or detergents.

## 2014-12-27 ENCOUNTER — Emergency Department (HOSPITAL_COMMUNITY)
Admission: EM | Admit: 2014-12-27 | Discharge: 2014-12-27 | Disposition: A | Discharge status: Home / Self Care | Payer: Self-pay | Attending: Emergency Medicine | Admitting: Emergency Medicine

## 2014-12-27 ENCOUNTER — Emergency Department (HOSPITAL_COMMUNITY): Payer: Self-pay

## 2014-12-27 ENCOUNTER — Encounter (HOSPITAL_COMMUNITY): Payer: Self-pay | Admitting: Emergency Medicine

## 2014-12-27 DIAGNOSIS — Y9389 Activity, other specified: Secondary | ICD-10-CM | POA: Insufficient documentation

## 2014-12-27 DIAGNOSIS — S83001A Unspecified subluxation of right patella, initial encounter: Secondary | ICD-10-CM | POA: Insufficient documentation

## 2014-12-27 DIAGNOSIS — Y9289 Other specified places as the place of occurrence of the external cause: Secondary | ICD-10-CM | POA: Insufficient documentation

## 2014-12-27 DIAGNOSIS — Z720 Tobacco use: Secondary | ICD-10-CM | POA: Insufficient documentation

## 2014-12-27 DIAGNOSIS — Z791 Long term (current) use of non-steroidal anti-inflammatories (NSAID): Secondary | ICD-10-CM | POA: Insufficient documentation

## 2014-12-27 DIAGNOSIS — X58XXXA Exposure to other specified factors, initial encounter: Secondary | ICD-10-CM | POA: Insufficient documentation

## 2014-12-27 DIAGNOSIS — Y990 Civilian activity done for income or pay: Secondary | ICD-10-CM | POA: Insufficient documentation

## 2014-12-27 MED ORDER — DICLOFENAC SODIUM 50 MG PO TBEC: 50.0000 mg | DELAYED_RELEASE_TABLET | Freq: Two times a day (BID) | ORAL | Status: DC

## 2014-12-27 MED ORDER — HYDROCODONE-ACETAMINOPHEN 5-325 MG PO TABS: 1.0000 | ORAL_TABLET | Freq: Four times a day (QID) | ORAL | Status: DC | PRN

## 2014-12-27 MED ORDER — HYDROCODONE-ACETAMINOPHEN 5-325 MG PO TABS
1.0000 | ORAL_TABLET | Freq: Once | ORAL | Status: AC
  Administered 2014-12-27: 1 via ORAL
  Filled 2014-12-27: qty 1

## 2014-12-27 NOTE — Discharge Instructions (Signed)
Do not take the narcotic if driving or at work because it will make you sleepy. Follow up with Dr. Romeo Apple if symptoms are not improving over the next 2 days.

## 2014-12-27 NOTE — ED Notes (Signed)
Pt c/o of RT knee pain since 1100 this morning. Pt denies injury/fall. No deformity noted.

## 2014-12-27 NOTE — ED Provider Notes (Signed)
Medical screening examination/treatment/procedure(s) were conducted as a shared visit with non-physician practitioner(s) and myself.  I personally evaluated the patient during the encounter.  24 year old male with knee pain after a twisting motion at work. Slight subluxation on initial x-ray however on physical examination patella is freely mobile. Pulses are intact. He is able to bear weight on just that one leg albeit with some pain. Feel like he likely had a dislocation or subluxation that spontaneously reduced ages feeling the pain from that. He is put in a knee immobilizer, given crutches and instructions on weightbearing as tolerated. He'll follow up with orthopedics if this doesn't improve in 3-4 days as he may need MRI to evaluate for any kind of meniscal or ligamentous injuries.     Marily Memos, MD 12/27/14 [phone]

## 2014-12-27 NOTE — ED Provider Notes (Signed)
CSN: [id number]     Arrival date & time 12/27/14  1542 History   First MD Initiated Contact with Patient 12/27/14 1614     Chief Complaint  Patient presents with  . Knee Pain     (Consider location/radiation/quality/duration/timing/severity/associated sxs/prior Treatment) Patient is a 24 y.o. male presenting with knee pain. The history is provided by the patient.  Knee Pain Location:  Knee Time since incident:  5 hours Injury: no   Knee location:  R knee Pain details:    Quality:  Shooting and sharp   Radiates to:  R leg   Severity:  Severe   Onset quality:  Sudden   Timing:  Constant Chronicity:  New Dislocation: yes   Foreign body present:  No foreign bodies Prior injury to area:  No Relieved by:  Nothing Worsened by:  Bearing weight Ineffective treatments:  None tried  Jonathon Wright is a 24 y.o. male who presents to the ED with right knee pain. He reports that he was at work and turned to get something and felt a sharp pain in his right knee. He has been unable to bear weight since then. He denies any problems with the knee prior to this.   History reviewed. No pertinent past medical history. Past Surgical History  Procedure Laterality Date  . Wisdom tooth extraction     No family history on file. Social History  Substance Use Topics  . Smoking status: Current Every Day Smoker -- 1.00 packs/day for 8 years    Types: Cigarettes  . Smokeless tobacco: None  . Alcohol Use: No    Review of Systems  Musculoskeletal: Positive for arthralgias.       Right knee pain.  all other systems negative    Allergies  Review of patient's allergies indicates no known allergies.  Home Medications   Prior to Admission medications   Medication Sig Start Date End Date Taking? Authorizing Provider  diclofenac (VOLTAREN) 50 MG EC tablet Take 1 tablet (50 mg total) by mouth 2 (two) times daily. 12/27/14   Jae Bruck Orlene Och, NP  HYDROcodone-acetaminophen (NORCO/VICODIN) 5-325 MG tablet  Take 1 tablet by mouth every 6 (six) hours as needed. 12/27/14   Tekesha Almgren Orlene Och, NP   BP 114/64 mmHg  Pulse 64  Temp(Src) 98.1 F (36.7 C) (Oral)  Resp 16  Ht  (1.753 m)  Wt 190 lb (86.183 kg)  BMI 28.05 kg/m2  SpO2 99% Physical Exam  Constitutional: He is oriented to person, place, and time. He appears well-developed and well-nourished. No distress.  HENT:  Head: Normocephalic.  Eyes: Conjunctivae and EOM are normal.  Neck: Normal range of motion. Neck supple.  Cardiovascular: Normal rate.   Pulmonary/Chest: Effort normal.  Musculoskeletal:       Right knee: He exhibits decreased range of motion, swelling, abnormal alignment and abnormal patellar mobility. He exhibits no erythema. Tenderness found.  Neurological: He is alert and oriented to person, place, and time. No cranial nerve deficit.  Skin: Skin is warm and dry.  Psychiatric: He has a normal mood and affect. His behavior is normal.  Nursing note and vitals reviewed.   ED Course  Procedures (including critical care time) Labs Review Labs Reviewed - No data to display  Imaging Review Dg Knee Complete 4 Views Right  12/27/2014   CLINICAL DATA:  Pain after stepping backward  EXAM: RIGHT KNEE - COMPLETE 4+ VIEW  COMPARISON:  None.  FINDINGS: [id number], weight-bearing tunnel, weight-bearing lateral, and sunrise patellar  images obtained. There is evidence suggesting mild lateral subluxation of the patella. No fracture or dislocation. No joint effusion. Joint spaces appear intact. No erosive change.  IMPRESSION: Evidence suggesting a degree of lateral subluxation of the patella. No fracture or dislocation. No joint effusion. No appreciable joint space narrowing.   Electronically Signed   By: Bretta Bang III M.D.   On: 12/27/2014 16:12   I have personally reviewed and evaluated these images as part of my medical decision-making.  Dr Clayborne Dana in to examine the patient and reduced patella subluxation.   MDM  24  y.o. male with right knee pain stable for d/c without neurovascular compromise. Knee immobilizer applied, ice, rest, pain management and follow up with ortho. Discussed with the patient clinical and x-ray findings and plan of care. All questioned fully answered.   Final diagnoses:  Patellar subluxation, right, initial encounter        [facility], NP 12/27/14 1648  Marily Memos, MD 12/27/14 1478

## 2015-01-26 ENCOUNTER — Encounter (HOSPITAL_COMMUNITY): Payer: Self-pay

## 2015-01-26 ENCOUNTER — Emergency Department (HOSPITAL_COMMUNITY)
Admission: EM | Admit: 2015-01-26 | Discharge: 2015-01-26 | Disposition: A | Discharge status: Home / Self Care | Payer: Self-pay | Attending: Emergency Medicine | Admitting: Emergency Medicine

## 2015-01-26 DIAGNOSIS — G8929 Other chronic pain: Secondary | ICD-10-CM | POA: Insufficient documentation

## 2015-01-26 DIAGNOSIS — X58XXXA Exposure to other specified factors, initial encounter: Secondary | ICD-10-CM | POA: Insufficient documentation

## 2015-01-26 DIAGNOSIS — Y9289 Other specified places as the place of occurrence of the external cause: Secondary | ICD-10-CM | POA: Insufficient documentation

## 2015-01-26 DIAGNOSIS — Y9389 Activity, other specified: Secondary | ICD-10-CM | POA: Insufficient documentation

## 2015-01-26 DIAGNOSIS — T148XXA Other injury of unspecified body region, initial encounter: Secondary | ICD-10-CM

## 2015-01-26 DIAGNOSIS — S29012A Strain of muscle and tendon of back wall of thorax, initial encounter: Secondary | ICD-10-CM | POA: Insufficient documentation

## 2015-01-26 DIAGNOSIS — Y998 Other external cause status: Secondary | ICD-10-CM | POA: Insufficient documentation

## 2015-01-26 DIAGNOSIS — M546 Pain in thoracic spine: Secondary | ICD-10-CM

## 2015-01-26 DIAGNOSIS — Z720 Tobacco use: Secondary | ICD-10-CM | POA: Insufficient documentation

## 2015-01-26 MED ORDER — METHOCARBAMOL 500 MG PO TABS: 500.0000 mg | ORAL_TABLET | Freq: Three times a day (TID) | ORAL | Status: DC

## 2015-01-26 MED ORDER — DEXAMETHASONE 4 MG PO TABS: 4.0000 mg | ORAL_TABLET | Freq: Two times a day (BID) | ORAL | Status: DC

## 2015-01-26 MED ORDER — MELOXICAM 15 MG PO TABS: 15.0000 mg | ORAL_TABLET | Freq: Every day | ORAL | Status: DC

## 2015-01-26 NOTE — ED Notes (Signed)
Pt reports woke up with pain in mid back radiating to r knee.  Reports pain got worse at work today.  Says he picked something up and felt a pop.

## 2015-01-26 NOTE — Discharge Instructions (Signed)

## 2015-01-26 NOTE — ED Provider Notes (Signed)
CSN: [id number]     Arrival date & time 01/26/15  1057 History   First MD Initiated Contact with Patient 01/26/15 1204     Chief Complaint  Patient presents with  . Back Pain     (Consider location/radiation/quality/duration/timing/severity/associated sxs/prior Treatment) Patient is a 24 y.o. male presenting with back pain. The history is provided by the patient.  Back Pain Location:  Thoracic spine Quality:  Shooting and aching Radiates to:  R knee Pain severity:  Moderate Pain is:  Same all the time Onset quality:  Gradual Duration:  1 week Timing:  Intermittent Progression:  Worsening Chronicity:  Chronic Context: lifting heavy objects   Context comment:  Lifting a TV and twisting Relieved by:  Nothing Worsened by:  Movement Associated symptoms: no bladder incontinence, no bowel incontinence and no perianal numbness   Risk factors: no recent surgery     History reviewed. No pertinent past medical history. Past Surgical History  Procedure Laterality Date  . Wisdom tooth extraction     No family history on file. Social History  Substance Use Topics  . Smoking status: Current Every Day Smoker -- 1.00 packs/day for 8 years    Types: Cigarettes  . Smokeless tobacco: None  . Alcohol Use: No    Review of Systems  Gastrointestinal: Negative for bowel incontinence.  Genitourinary: Negative for bladder incontinence.  Musculoskeletal: Positive for back pain and arthralgias.  All other systems reviewed and are negative.     Allergies  Review of patient's allergies indicates no known allergies.  Home Medications   Prior to Admission medications   Medication Sig Start Date End Date Taking? Authorizing Provider  diclofenac (VOLTAREN) 50 MG EC tablet Take 1 tablet (50 mg total) by mouth 2 (two) times daily. Patient not taking: Reported on 01/26/2015 12/27/14   Janne Napoleon, NP  HYDROcodone-acetaminophen (NORCO/VICODIN) 5-325 MG tablet Take 1 tablet by mouth every 6  (six) hours as needed. Patient not taking: Reported on 01/26/2015 12/27/14   Hope Orlene Och, NP   BP 124/75 mmHg  Pulse 74  Temp(Src) 97.7 F (36.5 C) (Oral)  Resp 18  Ht  (1.753 m)  Wt 195 lb (88.451 kg)  BMI 28.78 kg/m2  SpO2 100% Physical Exam  Constitutional: He is oriented to person, place, and time. He appears well-developed and well-nourished.  Non-toxic appearance.  HENT:  Head: Normocephalic.  Right Ear: Tympanic membrane and external ear normal.  Left Ear: Tympanic membrane and external ear normal.  Eyes: EOM and lids are normal. Pupils are equal, round, and reactive to light.  Neck: Normal range of motion. Neck supple. Carotid bruit is not present.  Cardiovascular: Normal rate, regular rhythm, normal heart sounds, intact distal pulses and normal pulses.   Pulmonary/Chest: Breath sounds normal. No respiratory distress.  Abdominal: Soft. Bowel sounds are normal. There is no tenderness. There is no guarding.  Musculoskeletal: Normal range of motion.       Thoracic back: He exhibits pain and spasm.       Back:  Lymphadenopathy:       Head (right side): No submandibular adenopathy present.       Head (left side): No submandibular adenopathy present.    He has no cervical adenopathy.  Neurological: He is alert and oriented to person, place, and time. He has normal strength. No cranial nerve deficit or sensory deficit.  Skin: Skin is warm and dry.  Psychiatric: He has a normal mood and affect. His speech is normal.  Nursing note and vitals reviewed.   ED Course  Procedures (including critical care time) Labs Review Labs Reviewed - No data to display  Imaging Review No results found. I have personally reviewed and evaluated these images and lab results as part of my medical decision-making.   EKG Interpretation None      MDM  Exam favors muscle strain of the thoracic paraspinal area. Rx for baclofen, decadron, and mobic given to the pt.   Final diagnoses:    None    **I have reviewed nursing notes, vital signs, and all appropriate lab and imaging results for this patient.Ivery Quale*    Sully Dyment, PA-C 01/26/15 1237  Samuel JesterKathleen McManus, DO 01/29/15 2052

## 2015-02-01 ENCOUNTER — Encounter: Payer: Self-pay | Admitting: Family Medicine

## 2015-02-01 ENCOUNTER — Ambulatory Visit (INDEPENDENT_AMBULATORY_CARE_PROVIDER_SITE_OTHER): Payer: Worker's Compensation | Admitting: Family Medicine

## 2015-02-01 ENCOUNTER — Ambulatory Visit (INDEPENDENT_AMBULATORY_CARE_PROVIDER_SITE_OTHER): Payer: Worker's Compensation

## 2015-02-01 VITALS — BP 110/66 | HR 68 | Temp 97.1°F | Ht 69.0 in | Wt 203.5 lb

## 2015-02-01 DIAGNOSIS — R202 Paresthesia of skin: Secondary | ICD-10-CM | POA: Diagnosis not present

## 2015-02-01 DIAGNOSIS — Y990 Civilian activity done for income or pay: Secondary | ICD-10-CM

## 2015-02-01 DIAGNOSIS — S6991XA Unspecified injury of right wrist, hand and finger(s), initial encounter: Secondary | ICD-10-CM | POA: Diagnosis not present

## 2015-02-01 DIAGNOSIS — R200 Anesthesia of skin: Secondary | ICD-10-CM | POA: Insufficient documentation

## 2015-02-01 DIAGNOSIS — M25531 Pain in right wrist: Secondary | ICD-10-CM | POA: Diagnosis not present

## 2015-02-01 MED ORDER — TRAMADOL HCL 50 MG PO TABS
50.0000 mg | ORAL_TABLET | Freq: Three times a day (TID) | ORAL | Status: DC | PRN
Start: 2015-02-01 — End: 2015-02-06

## 2015-02-01 NOTE — Progress Notes (Signed)
   HPI  Patient presents today here with wrist injury.  Patient was work today on 02/01/2015 when he hit his right wrist with a hammer. He is unsure of exactly where he hit the hand, whether his hand or wrist. He has right distal forearm and proximal wrist pain. He has pain with flexion or extension of the wrist and movement of his fingers. He states that since the injury he has fingers have paresthesias and feel like they're asleep. He has normal strength in the hand   PMH: Smoking status noted ROS: Per HPI  Objective: BP 110/66 mmHg  Pulse 68  Temp(Src) 97.1 F (36.2 C) (Oral)  Ht 5\' 9"  (1.753 m)  Wt 203 lb 8 oz (92.307 kg)  BMI 30.04 kg/m2 Gen: NAD, alert, cooperative with exam HEENT: NCAT Ext: No edema, warm Neuro: Alert and oriented, strength 5/5 strength BL.  MSK Pain with palpation of the R anatomic snuff box, pain with palpation of distal forearm and proximal hand to palpation, pain with flexion and extension   Assessment and plan:  # R wrist injury No fracture seen on XR,. Placed in a thumb spica for repeat XR in 2 weeks given pain in snuff box- rule out scaphoid fracture With numbness will go ahesad and send to hand surgery for eval Normal blood flow in the hand No lifting/pulling/twisting of the right hand written for work.    Orders Placed This Encounter  Procedures  . DG Wrist Complete Right    Standing Status: Future     Number of Occurrences: 1     Standing Expiration Date: 04/02/2016    Order Specific Question:  Reason for Exam (SYMPTOM  OR DIAGNOSIS REQUIRED)    Answer:  work injury    Order Specific Question:  Preferred imaging location?    Answer:  Internal    No orders of the defined types were placed in this encounter.    Murtis SinkSam Bradshaw, MD [facility] 02/01/2015, 3:32 PM

## 2015-02-01 NOTE — Patient Instructions (Signed)
Great to meet you!  Wear the wrist splint until you are cleared with repeat x ray  You will need a repeat x ray in 2 weeks, we are arranging hand surgery follow up.

## 2015-02-06 ENCOUNTER — Telehealth: Payer: Self-pay | Admitting: Family Medicine

## 2015-02-06 MED ORDER — TRAMADOL HCL 50 MG PO TABS: 50.0000 mg | ORAL_TABLET | Freq: Three times a day (TID) | ORAL | Status: DC | PRN

## 2015-02-06 NOTE — Telephone Encounter (Signed)
Pt calling in today. Medication is not helping with his hand pain. It helped the first couple days. Wanting another medication for this. Referral has not been completed yet d/t worker's comp information is not complete.

## 2015-02-06 NOTE — Telephone Encounter (Signed)
He needs to be seen by hand surgery.    I am ok refilling tramadol over the phone this time, for stronger medication he needs to be seen, may take up to 2 trmamadol at a time, however he should be getting out of the acute phase of treatment and weaning off of strong pain meds.   Murtis SinkSam Bradshaw, MD [facility] 02/06/2015, 2:59 PM

## 2015-02-06 NOTE — Telephone Encounter (Signed)
[facility]. Spoke with our ins dept who is process of receiving work comp information now so that referral can be made.

## 2015-02-17 ENCOUNTER — Other Ambulatory Visit: Payer: Self-pay | Admitting: Family Medicine

## 2015-03-10 ENCOUNTER — Emergency Department (HOSPITAL_COMMUNITY)
Admission: EM | Admit: 2015-03-10 | Discharge: 2015-03-10 | Disposition: A | Discharge status: Home / Self Care | Payer: Self-pay | Attending: Emergency Medicine | Admitting: Emergency Medicine

## 2015-03-10 ENCOUNTER — Encounter (HOSPITAL_COMMUNITY): Payer: Self-pay | Admitting: Emergency Medicine

## 2015-03-10 DIAGNOSIS — A084 Viral intestinal infection, unspecified: Secondary | ICD-10-CM | POA: Insufficient documentation

## 2015-03-10 DIAGNOSIS — F17210 Nicotine dependence, cigarettes, uncomplicated: Secondary | ICD-10-CM | POA: Insufficient documentation

## 2015-03-10 LAB — CBC WITH DIFFERENTIAL/PLATELET
BASOS ABS: 0 10*3/uL (ref 0.0–0.1)
BASOS PCT: 0 %
EOS ABS: 0.1 10*3/uL (ref 0.0–0.7)
Eosinophils Relative: 1 %
HCT: 44 % (ref 39.0–52.0)
HEMOGLOBIN: 15.1 g/dL (ref 13.0–17.0)
Lymphocytes Relative: 18 %
Lymphs Abs: 1.5 10*3/uL (ref 0.7–4.0)
MCH: 31.4 pg (ref 26.0–34.0)
MCHC: 34.3 g/dL (ref 30.0–36.0)
MCV: 91.5 fL (ref 78.0–100.0)
MONOS PCT: 11 %
Monocytes Absolute: 1 10*3/uL (ref 0.1–1.0)
NEUTROS ABS: 5.9 10*3/uL (ref 1.7–7.7)
NEUTROS PCT: 70 %
Platelets: 214 10*3/uL (ref 150–400)
RBC: 4.81 MIL/uL (ref 4.22–5.81)
RDW: 12.5 % (ref 11.5–15.5)
WBC: 8.5 10*3/uL (ref 4.0–10.5)

## 2015-03-10 LAB — COMPREHENSIVE METABOLIC PANEL
ALT: 15 U/L — AB (ref 17–63)
ANION GAP: 6 (ref 5–15)
AST: 13 U/L — ABNORMAL LOW (ref 15–41)
Albumin: 4.2 g/dL (ref 3.5–5.0)
Alkaline Phosphatase: 44 U/L (ref 38–126)
BILIRUBIN TOTAL: 0.4 mg/dL (ref 0.3–1.2)
BUN: 19 mg/dL (ref 6–20)
CALCIUM: 9.1 mg/dL (ref 8.9–10.3)
CO2: 27 mmol/L (ref 22–32)
CREATININE: 0.87 mg/dL (ref 0.61–1.24)
Chloride: 110 mmol/L (ref 101–111)
Glucose, Bld: 108 mg/dL — ABNORMAL HIGH (ref 65–99)
Potassium: 4.2 mmol/L (ref 3.5–5.1)
Sodium: 143 mmol/L (ref 135–145)
TOTAL PROTEIN: 6.7 g/dL (ref 6.5–8.1)

## 2015-03-10 MED ORDER — PROMETHAZINE HCL 25 MG PO TABS: 25.0000 mg | ORAL_TABLET | Freq: Four times a day (QID) | ORAL | Status: DC | PRN

## 2015-03-10 MED ORDER — SODIUM CHLORIDE 0.9 % IV BOLUS (SEPSIS)
1000.0000 mL | Freq: Once | INTRAVENOUS | Status: AC
  Administered 2015-03-10: 1000 mL via INTRAVENOUS

## 2015-03-10 MED ORDER — ONDANSETRON HCL 4 MG/2ML IJ SOLN
4.0000 mg | Freq: Once | INTRAMUSCULAR | Status: AC
  Administered 2015-03-10: 4 mg via INTRAVENOUS
  Filled 2015-03-10: qty 2

## 2015-03-10 NOTE — ED Notes (Signed)
Patient complaining of vomiting and diarrhea starting yesterday morning. States he ate [employer] the night before and states "everytime I eat steak from there I get sick."

## 2015-03-10 NOTE — Discharge Instructions (Signed)

## 2015-03-10 NOTE — ED Provider Notes (Signed)
CSN: [id number]     Arrival date & time 03/10/15  [id number]   By signing my name below, I, Jarvis Morganaylor Ferguson, attest that this documentation has been prepared under the direction and in the presence of Nelva Nayobert Akyla Vavrek, MD. Electronically Signed: Jarvis Morganaylor Ferguson, ED Scribe. 03/10/2015. 11:56 AM.      Chief Complaint  Patient presents with  . Emesis  . Diarrhea   The history is provided by the patient. No language interpreter was used.    HPI Comments: Jonathon Wright is a 24 y.o. male who presents to the Emergency Department complaining of episodic, moderate, non-bloody and non bilious, watery diarrhea onset 2 days ago. He reports associated nausea and vomiting. He states his last episode of vomiting occurred 2 hours ago. Pt notes he ate [employer] 2 days ago and reports similar symptoms to this when he has eaten [employer] in the past. He denies any aggravating/alleviating factors. He denies any recent antibiotic use. He denies any fever, abdominal pain, or other associated symptoms.   History reviewed. No pertinent past medical history. Past Surgical History  Procedure Laterality Date  . Wisdom tooth extraction     History reviewed. No pertinent family history. Social History  Substance Use Topics  . Smoking status: Current Every Day Smoker -- 1.00 packs/day for 8 years    Types: Cigarettes  . Smokeless tobacco: None  . Alcohol Use: No    Review of Systems  All other systems reviewed and are negative.     Allergies  Review of patient's allergies indicates no known allergies.  Home Medications   Prior to Admission medications   Medication Sig Start Date End Date Taking? Authorizing Provider  ibuprofen (ADVIL,MOTRIN) 200 MG tablet Take 800 mg by mouth every 6 (six) hours as needed.   Yes Historical Provider, MD  traMADol (ULTRAM) 50 MG tablet Take 1-2 tablets (50-100 mg total) by mouth every 8 (eight) hours as needed. 02/06/15  Yes Elenora GammaSamuel L Bradshaw, MD  dexamethasone (DECADRON) 4 MG  tablet Take 1 tablet (4 mg total) by mouth 2 (two) times daily with a meal. Patient not taking: Reported on 03/10/2015 01/26/15   Ivery QualeHobson Bryant, PA-C  meloxicam (MOBIC) 15 MG tablet Take 1 tablet (15 mg total) by mouth daily. Patient not taking: Reported on 03/10/2015 01/26/15   Ivery QualeHobson Bryant, PA-C  methocarbamol (ROBAXIN) 500 MG tablet Take 1 tablet (500 mg total) by mouth 3 (three) times daily. Patient not taking: Reported on 03/10/2015 01/26/15   Ivery QualeHobson Bryant, PA-C  promethazine (PHENERGAN) 25 MG tablet Take 1 tablet (25 mg total) by mouth every 6 (six) hours as needed for nausea or vomiting. 03/10/15   Nelva Nayobert Lonnel Gjerde, MD   Triage Vitals: BP 129/85 mmHg  Pulse 86  Temp(Src) 97.8 F (36.6 C) (Oral)  Resp 16  Ht 5\' 9"  (1.753 m)  Wt 202 lb (91.627 kg)  BMI 29.82 kg/m2  SpO2 100%    Physical Exam   Physical Exam  Nursing note and vitals reviewed. Constitutional: He is oriented to person, place, and time. He appears well-developed and well-nourished. No distress.  HENT:  Head: Normocephalic and atraumatic.  Eyes: Pupils are equal, round, and reactive to light.  Neck: Normal range of motion.  Cardiovascular: Normal rate and intact distal pulses.   Pulmonary/Chest: No respiratory distress.  Abdominal: Normal appearance. He exhibits no distension.  Nontender to palpation all quadrants.  Active bowel sounds in all quadrants.  No rebound or guarding tenderness.  No physical evidence of peritoneal  irritation.   Musculoskeletal: Normal range of motion.  Neurological: He is alert and oriented to person, place, and time. No cranial nerve deficit.  Skin: Skin is warm and dry. No rash noted.  Psychiatric: He has a normal mood and affect. His behavior is normal.    ED Course  Procedures (including critical care time) Medications  sodium chloride 0.9 % bolus 1,000 mL (1,000 mLs Intravenous New Bag/Given 03/10/15 0940)  ondansetron (ZOFRAN) injection 4 mg (4 mg Intravenous Given 03/10/15 0940)      DIAGNOSTIC STUDIES: Oxygen Saturation is 100% on RA, normal by my interpretation.    COORDINATION OF CARE:  9:32 AM- Will order IV fluids, Zofran, CMP and CBC.  Pt advised of plan for treatment and pt agrees.    Labs Review Labs Reviewed  COMPREHENSIVE METABOLIC PANEL - Abnormal; Notable for the following:    Glucose, Bld 108 (*)    AST 13 (*)    ALT 15 (*)    All other components within normal limits  CBC WITH DIFFERENTIAL/PLATELET   After treatment in the ED the patient feels back to baseline and wants to go home.  MDM   Final diagnoses:  Viral gastroenteritis    I personally performed the services described in this documentation, which was scribed in my presence. The recorded information has been reviewed and considered.     Nelva Nay, MD 03/10/15 [phone]

## 2015-05-27 ENCOUNTER — Emergency Department (HOSPITAL_COMMUNITY)
Admission: EM | Admit: 2015-05-27 | Discharge: 2015-05-28 | Disposition: A | Discharge status: Home / Self Care | Payer: Self-pay | Attending: Emergency Medicine | Admitting: Emergency Medicine

## 2015-05-27 ENCOUNTER — Encounter (HOSPITAL_COMMUNITY): Payer: Self-pay | Admitting: *Deleted

## 2015-05-27 ENCOUNTER — Emergency Department (HOSPITAL_COMMUNITY): Payer: Self-pay

## 2015-05-27 DIAGNOSIS — M940 Chondrocostal junction syndrome [Tietze]: Secondary | ICD-10-CM | POA: Insufficient documentation

## 2015-05-27 DIAGNOSIS — Z791 Long term (current) use of non-steroidal anti-inflammatories (NSAID): Secondary | ICD-10-CM | POA: Insufficient documentation

## 2015-05-27 DIAGNOSIS — Z87891 Personal history of nicotine dependence: Secondary | ICD-10-CM | POA: Insufficient documentation

## 2015-05-27 LAB — CBC WITH DIFFERENTIAL/PLATELET
BASOS ABS: 0 10*3/uL (ref 0.0–0.1)
Basophils Relative: 1 %
EOS ABS: 0.1 10*3/uL (ref 0.0–0.7)
EOS PCT: 1 %
HCT: 41 % (ref 39.0–52.0)
Hemoglobin: 14.7 g/dL (ref 13.0–17.0)
LYMPHS PCT: 30 %
Lymphs Abs: 2.2 10*3/uL (ref 0.7–4.0)
MCH: 31.5 pg (ref 26.0–34.0)
MCHC: 35.9 g/dL (ref 30.0–36.0)
MCV: 88 fL (ref 78.0–100.0)
MONO ABS: 0.6 10*3/uL (ref 0.1–1.0)
Monocytes Relative: 8 %
Neutro Abs: 4.3 10*3/uL (ref 1.7–7.7)
Neutrophils Relative %: 60 %
PLATELETS: 221 10*3/uL (ref 150–400)
RBC: 4.66 MIL/uL (ref 4.22–5.81)
RDW: 12 % (ref 11.5–15.5)
WBC: 7.2 10*3/uL (ref 4.0–10.5)

## 2015-05-27 MED ORDER — KETOROLAC TROMETHAMINE 30 MG/ML IJ SOLN
30.0000 mg | Freq: Once | INTRAMUSCULAR | Status: AC
Start: 2015-05-27 — End: 2015-05-27
  Administered 2015-05-27: 30 mg via INTRAVENOUS
  Filled 2015-05-27: qty 1

## 2015-05-27 MED ORDER — CYCLOBENZAPRINE HCL 10 MG PO TABS
10.0000 mg | ORAL_TABLET | Freq: Once | ORAL | Status: AC
  Administered 2015-05-27: 10 mg via ORAL
  Filled 2015-05-27: qty 1

## 2015-05-27 NOTE — ED Notes (Signed)
Pt report centralized chest pain/pressure since around 5:30 pm. Pt has taken ibuprofen with no relief. Pt also reports sob.

## 2015-05-27 NOTE — ED Provider Notes (Signed)
CSN: [id number]     Arrival date & time 05/27/15  2216 History   First MD Initiated Contact with Patient 05/27/15 2317    Chief Complaint  Patient presents with  . Chest Pain     (Consider location/radiation/quality/duration/timing/severity/associated sxs/prior Treatment) HPI patient reports about 4:30 this afternoon while he was getting ready to go to work he started having a central chest pain that he describes as pressure and tightness. He states he feels like he has someone sitting on his chest. The pain has been there constantly. He states deep breathing makes the pain worse, nothing makes it feel better. He denies shortness of breath, nausea, vomiting, or radiation of the pain. He states he did break out into a sweat. He states he's never had this pain before. He denies any change in his activity. He states there is no family history of heart disease.  PCP  none  History reviewed. No pertinent past medical history. Past Surgical History  Procedure Laterality Date  . Wisdom tooth extraction     History reviewed. No pertinent family history. Social History  Substance Use Topics  . Smoking status: Former Smoker -- 1.00 packs/day for 8 years    Types: Cigarettes    Quit date: 04/29/2015  . Smokeless tobacco: None  . 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     Allergies  Review of patient's allergies indicates no known allergies.  Home Medications   Prior to Admission medications   Medication Sig Start Date End Date Taking? Authorizing Provider  ibuprofen (ADVIL,MOTRIN) 200 MG tablet Take 800 mg by mouth every 6 (six) hours as needed for mild pain or moderate pain.    Yes Historical Provider, MD  cyclobenzaprine (FLEXERIL) 5 MG tablet Take 1 tablet (5 mg total) by mouth 3 (three) times daily as needed (for muscle pain). 05/28/15   Devoria AlbeIva Lanea Vankirk, MD  naproxen (NAPROSYN) 500 MG  tablet Take 1 po BID with food prn pain 05/28/15   Devoria AlbeIva Rawlins Stuard, MD   BP 112/78 mmHg  Pulse 59  Temp(Src) 98.4 F (36.9 C) (Oral)  Resp 14  Ht 5\' 9"  (1.753 m)  Wt 190 lb (86.183 kg)  BMI 28.05 kg/m2  SpO2 98%  Vital signs normal   Physical Exam  Constitutional: He is oriented to person, place, and time. He appears well-developed and well-nourished.  Non-toxic appearance. He does not appear ill. No distress.  HENT:  Head: Normocephalic and atraumatic.  Right Ear: External ear normal.  Left Ear: External ear normal.  Nose: Nose normal. No mucosal edema or rhinorrhea.  Mouth/Throat: Oropharynx is clear and moist and mucous membranes are normal. No dental abscesses or uvula swelling.  Eyes: Conjunctivae and EOM are normal. Pupils are equal, round, and reactive to light.  Neck: Normal range of motion and full passive range of motion without pain. Neck supple.  Cardiovascular: Normal rate, regular rhythm and normal heart sounds.  Exam reveals no gallop and no friction rub.   No murmur heard. Pulmonary/Chest: Effort normal and breath sounds normal. No respiratory distress. He has no wheezes. He has no rhonchi. He has no rales. He exhibits no tenderness and no crepitus.    Patient is very tender to palpation over the left costochondral joints more than the right  Abdominal: Soft. Normal appearance and bowel sounds are normal. He exhibits no distension. There is no tenderness. There  is no rebound and no guarding.  Musculoskeletal: Normal range of motion. He exhibits no edema or tenderness.  Moves all extremities well.   Neurological: He is alert and oriented to person, place, and time. He has normal strength. No cranial nerve deficit.  Skin: Skin is warm, dry and intact. No rash noted. No erythema. No pallor.  Psychiatric: His speech is normal and behavior is normal. His mood appears anxious.  Nursing note and vitals reviewed.   ED Course  Procedures (including critical care  time)  Medications  ketorolac (TORADOL) 30 MG/ML injection 30 mg (30 mg Intravenous Given 05/27/15 2341)  cyclobenzaprine (FLEXERIL) tablet 10 mg (10 mg Oral Given 05/27/15 2341)   Patient was given IV Toradol and oral Flexeril for his chest wall pain and costochondritis.  At time of discharge we went over patient's test results. He states his pain is not gone but it's improved. We discussed his pain is from inflammation of his costochondral joints, he was reassured it was nothing indicating a serious problem with his heart or lungs. He was advised to do any heavy lifting or pulling for at least the next week and he was sent home on anti-inflammatory and muscle relaxer.  Labs Review Results for orders placed or performed during the hospital encounter of 05/27/15  Comprehensive metabolic panel  Result Value Ref Range   Sodium 138 135 - 145 mmol/L   Potassium 4.1 3.5 - 5.1 mmol/L   Chloride 105 101 - 111 mmol/L   CO2 26 22 - 32 mmol/L   Glucose, Bld 98 65 - 99 mg/dL   BUN 23 (H) 6 - 20 mg/dL   Creatinine, Ser 1.61 0.61 - 1.24 mg/dL   Calcium 9.0 8.9 - 09.6 mg/dL   Total Protein 6.9 6.5 - 8.1 g/dL   Albumin 4.4 3.5 - 5.0 g/dL   AST 16 15 - 41 U/L   ALT 16 (L) 17 - 63 U/L   Alkaline Phosphatase 50 38 - 126 U/L   Total Bilirubin 0.5 0.3 - 1.2 mg/dL   GFR calc non Af Amer >60 >60 mL/min   GFR calc Af Amer >60 >60 mL/min   Anion gap 7 5 - 15  CBC with Differential  Result Value Ref Range   WBC 7.2 4.0 - 10.5 K/uL   RBC 4.66 4.22 - 5.81 MIL/uL   Hemoglobin 14.7 13.0 - 17.0 g/dL   HCT 04.5 40.9 - 81.1 %   MCV 88.0 78.0 - 100.0 fL   MCH 31.5 26.0 - 34.0 pg   MCHC 35.9 30.0 - 36.0 g/dL   RDW 91.4 78.2 - 95.6 %   Platelets 221 150 - 400 K/uL   Neutrophils Relative % 60 %   Neutro Abs 4.3 1.7 - 7.7 K/uL   Lymphocytes Relative 30 %   Lymphs Abs 2.2 0.7 - 4.0 K/uL   Monocytes Relative 8 %   Monocytes Absolute 0.6 0.1 - 1.0 K/uL   Eosinophils Relative 1 %   Eosinophils Absolute 0.1 0.0 -  0.7 K/uL   Basophils Relative 1 %   Basophils Absolute 0.0 0.0 - 0.1 K/uL  Troponin I  Result Value Ref Range   Troponin I <0.03 <0.031 ng/mL   Laboratory interpretation all normal      Imaging Review Dg Chest 2 View  05/28/2015  CLINICAL DATA:  Acute onset of centralized chest pain and pressure. Shortness of breath. Initial encounter. EXAM: CHEST  2 VIEW COMPARISON:  None. FINDINGS: The lungs are well-aerated  and clear. There is no evidence of focal opacification, pleural effusion or pneumothorax. The heart is normal in size; the mediastinal contour is within normal limits. No acute osseous abnormalities are seen. IMPRESSION: No acute cardiopulmonary process seen. Electronically Signed   By: Roanna Raider M.D.   On: 05/28/2015 00:42   I have personally reviewed and evaluated these images and lab results as part of my medical decision-making.   EKG Interpretation   Date/Time:  Saturday May 27 2015 22:38:51 EST Ventricular Rate:  68 PR Interval:  152 QRS Duration: 88 QT Interval:  358 QTC Calculation: 380 R Axis:   58 Text Interpretation:  Normal sinus rhythm Normal ECG No old tracing to  compare Confirmed by Dameion Briles  MD-I, Selden Noteboom ([id number]) on 05/27/2015 11:03:08 PM      MDM   Final diagnoses:  Costochondritis, acute   Discharge Medication List as of 05/28/2015  2:03 AM    START taking these medications   Details  cyclobenzaprine (FLEXERIL) 5 MG tablet Take 1 tablet (5 mg total) by mouth 3 (three) times daily as needed (for muscle pain)., Starting 05/28/2015, Until Discontinued, Print    naproxen (NAPROSYN) 500 MG tablet Take 1 po BID with food prn pain, Print        Plan discharge  Devoria Albe, MD, Concha Pyo, MD 05/28/15 [phone]

## 2015-05-28 LAB — COMPREHENSIVE METABOLIC PANEL
ALBUMIN: 4.4 g/dL (ref 3.5–5.0)
ALT: 16 U/L — AB (ref 17–63)
AST: 16 U/L (ref 15–41)
Alkaline Phosphatase: 50 U/L (ref 38–126)
Anion gap: 7 (ref 5–15)
BUN: 23 mg/dL — AB (ref 6–20)
CHLORIDE: 105 mmol/L (ref 101–111)
CO2: 26 mmol/L (ref 22–32)
CREATININE: 0.87 mg/dL (ref 0.61–1.24)
Calcium: 9 mg/dL (ref 8.9–10.3)
GFR calc Af Amer: >60 mL/min (ref >60)
GFR calc non Af Amer: >60 mL/min (ref >60)
GLUCOSE: 98 mg/dL (ref 65–99)
Potassium: 4.1 mmol/L (ref 3.5–5.1)
SODIUM: 138 mmol/L (ref 135–145)
Total Bilirubin: 0.5 mg/dL (ref 0.3–1.2)
Total Protein: 6.9 g/dL (ref 6.5–8.1)

## 2015-05-28 LAB — TROPONIN I: Troponin I: <0.03 ng/mL (ref <0.031)

## 2015-05-28 MED ORDER — NAPROXEN 500 MG PO TABS: ORAL_TABLET | ORAL | Status: DC

## 2015-05-28 MED ORDER — CYCLOBENZAPRINE HCL 5 MG PO TABS: 5.0000 mg | ORAL_TABLET | Freq: Three times a day (TID) | ORAL | Status: DC | PRN

## 2015-05-28 NOTE — Discharge Instructions (Signed)
Use ice and heat on your chest for comfort. Take the medication as prescribed. Try not to do any heavy lifting or pulling for the next week. Recheck if you get fever, shortness of breath, or your pain seems worse. Chest Wall Pain Chest wall pain is pain in or around the bones and muscles of your chest. Sometimes, an injury causes this pain. Sometimes, the cause may not be known. This pain may take several weeks or longer to get better. HOME CARE Pay attention to any changes in your symptoms. Take these actions to help with your pain:  Rest as told by your doctor.  Avoid activities that cause pain. Try not to use your chest, belly (abdominal), or side muscles to lift heavy things.  If directed, apply ice to the painful area:  Put ice in a plastic bag.  Place a towel between your skin and the bag.  Leave the ice on for 20 minutes, 2-3 times per day.  Take over-the-counter and prescription medicines only as told by your doctor.  Do not use tobacco products, including cigarettes, chewing tobacco, and e-cigarettes. If you need help quitting, ask your doctor.  Keep all follow-up visits as told by your doctor. This is important. GET HELP IF:  You have a fever.  Your chest pain gets worse.  You have new symptoms. GET HELP RIGHT AWAY IF:  You feel sick to your stomach (nauseous) or you throw up (vomit).  You feel sweaty or light-headed.  You have a cough with phlegm (sputum) or you cough up blood.  You are short of breath.   This information is not intended to replace advice given to you by your health care provider. Make sure you discuss any questions you have with your health care provider.   Document Released: 08/28/2007 Document Revised: 11/30/2014 Document Reviewed: 06/06/2014 Elsevier Interactive Patient Education 2016 Elsevier Inc[employer]  Costochondritis Costochondritis is a condition in which the tissue (cartilage) that connects your ribs with your breastbone (sternum) becomes  irritated. It causes pain in the chest and rib area. It usually goes away on its own over time. HOME CARE  Avoid activities that wear you out.  Do not strain your ribs. Avoid activities that use your:  Chest.  Belly.  Side muscles.  Put ice on the area for the first 2 days after the pain starts.  Put ice in a plastic bag.  Place a towel between your skin and the bag.  Leave the ice on for 20 minutes, 2-3 times a day.  Only take medicine as told by your doctor. GET HELP IF:  You have redness or puffiness (swelling) in the rib area.  Your pain does not go away with rest or medicine. GET HELP RIGHT AWAY IF:   Your pain gets worse.  You are very uncomfortable.  You have trouble breathing.  You cough up blood.  You start sweating or throwing up (vomiting).  You have a fever or lasting symptoms for more than 2-3 days.  You have a fever and your symptoms suddenly get worse. MAKE SURE YOU:   Understand these instructions.  Will watch your condition.  Will get help right away if you are not doing well or get worse.   This information is not intended to replace advice given to you by your health care provider. Make sure you discuss any questions you have with your health care provider.   Document Released: 08/28/2007 Document Revised: 11/11/2012 Document Reviewed: 10/13/2012 Elsevier Interactive Patient Education 2016  Elsevier Inc. ° °

## 2015-06-19 ENCOUNTER — Encounter (HOSPITAL_COMMUNITY): Payer: Self-pay | Admitting: Emergency Medicine

## 2015-06-19 ENCOUNTER — Emergency Department (HOSPITAL_COMMUNITY)
Admission: EM | Admit: 2015-06-19 | Discharge: 2015-06-19 | Disposition: A | Discharge status: Home / Self Care | Payer: Self-pay | Attending: Emergency Medicine | Admitting: Emergency Medicine

## 2015-06-19 DIAGNOSIS — Z87891 Personal history of nicotine dependence: Secondary | ICD-10-CM | POA: Insufficient documentation

## 2015-06-19 DIAGNOSIS — M546 Pain in thoracic spine: Secondary | ICD-10-CM | POA: Insufficient documentation

## 2015-06-19 MED ORDER — DEXAMETHASONE SODIUM PHOSPHATE 4 MG/ML IJ SOLN
8.0000 mg | Freq: Once | INTRAMUSCULAR | Status: AC
  Administered 2015-06-19: 8 mg via INTRAMUSCULAR
  Filled 2015-06-19: qty 2

## 2015-06-19 MED ORDER — METHOCARBAMOL 500 MG PO TABS: 500.0000 mg | ORAL_TABLET | Freq: Three times a day (TID) | ORAL | Status: DC

## 2015-06-19 MED ORDER — HYDROCODONE-ACETAMINOPHEN 5-325 MG PO TABS: 1.0000 | ORAL_TABLET | ORAL | Status: DC | PRN

## 2015-06-19 MED ORDER — HYDROCODONE-ACETAMINOPHEN 5-325 MG PO TABS
2.0000 | ORAL_TABLET | Freq: Once | ORAL | Status: AC
  Administered 2015-06-19: 2 via ORAL
  Filled 2015-06-19: qty 2

## 2015-06-19 MED ORDER — DIAZEPAM 5 MG PO TABS
10.0000 mg | ORAL_TABLET | Freq: Once | ORAL | Status: AC
  Administered 2015-06-19: 10 mg via ORAL
  Filled 2015-06-19: qty 2

## 2015-06-19 MED ORDER — INDOMETHACIN 25 MG PO CAPS
25.0000 mg | ORAL_CAPSULE | Freq: Once | ORAL | Status: AC
  Administered 2015-06-19: 25 mg via ORAL
  Filled 2015-06-19: qty 1

## 2015-06-19 NOTE — ED Notes (Signed)
PT c/o middle back pain unrelieved since previous visit and denies any new injuries.

## 2015-06-19 NOTE — ED Provider Notes (Signed)
CSN: [id number]     Arrival date & time 06/19/15  1155 History  By signing my name below, I, Lyndel Safe, attest that this documentation has been prepared under the direction and in the presence of Ivery Quale, PA-C. Electronically Signed: Lyndel Safe, ED Scribe. 06/19/2015. 3:07 PM.   Chief Complaint  Patient presents with  . Back Pain   Patient is a 25 y.o. male presenting with back pain. The history is provided by the patient. No language interpreter was used.  Back Pain Location:  Thoracic spine Quality: spasm. 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 male, with no chronic medical conditions, who presents to the Emergency Department complaining of waxing and waning, worsening, spasming mid thoracic back pain X 2-3 months, unrelieved by ibuprofen. 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 The pt states he has been evaluated multiple times for this same back pain and has received normal imaging on several visits. He has not been evaluated by a specialist but is being followed by his PCP for this complaint.  Pt notes he has a PCP appointment tomorrow but the pain was severe enough that he could not wait for 1 day.  Pt denies injury, trauma, or lift injury, bladder or bowel incontinence, numbness, tingling or weakness of BLE, or any other associated  symptoms. He is ambulatory without difficulty. NKDA   History reviewed. No pertinent past medical history. Past Surgical History  Procedure Laterality Date  . Wisdom tooth extraction     History reviewed. No pertinent family history. Social History  Substance Use Topics  . Smoking status: Former Smoker -- 1.00 packs/day for 8 years    Types: Cigarettes    Quit date: 04/29/2015  . Smokeless tobacco: None  . Alcohol Use: No    Review of Systems  Gastrointestinal: Negative for bowel incontinence.  Genitourinary: Negative for bladder incontinence.  Musculoskeletal: Positive for back pain.  Neurological: Negative for tingling, weakness, numbness and paresthesias.  All other systems reviewed and are negative.  Allergies  Review of patient's allergies indicates no known allergies.  Home Medications   Prior to Admission medications   Medication Sig Start Date End Date Taking? Authorizing Provider  cyclobenzaprine (FLEXERIL) 5 MG tablet Take 1 tablet (5 mg total) by mouth 3 (three) times daily as needed (for muscle pain). 05/28/15   Devoria Albe, MD  ibuprofen (ADVIL,MOTRIN) 200 MG tablet Take 800 mg by mouth every 6 (six) hours as needed for mild pain or moderate pain.     Historical Provider, MD  naproxen (NAPROSYN) 500 MG tablet Take 1 po BID with food prn pain 05/28/15   Devoria Albe, MD   BP 119/65 mmHg  Pulse 74  Temp(Src) 98.7 F (37.1 C) (Oral)  Resp 18  Ht  (1.753 m)  Wt 190 lb (86.183 kg)  BMI 28.05 kg/m2  SpO2 100% Physical Exam  Constitutional: He is oriented to person, place, and time. He appears well-developed and well-nourished. No distress.  HENT:  Head: Normocephalic.  Eyes: Conjunctivae are normal.  Neck: Normal range of motion. Neck supple.  Cardiovascular: Normal rate.   Pulmonary/Chest: Effort normal. No respiratory distress.  Musculoskeletal: Normal range of motion.  Pain to palpation in the mid thoracic area, soreness in bilateral paraspinal areas of  thoracic region, pain with attempted ROM of thoracic region,   Neurological: He is alert and oriented to person, place, and time. Coordination normal.  No gross motor or sensory deficits of the upper or lower extremities.   Skin: Skin is warm.  Psychiatric: He has a normal mood and affect. His behavior is normal.  Nursing note and vitals reviewed.   ED Course  Procedures  DIAGNOSTIC STUDIES: Oxygen Saturation is 100% on RA, normal by my interpretation.    COORDINATION OF CARE: 2:53 PM Discussed treatment plan with pt at bedside and pt agreed to plan.   MDM  Patient describes a pain to palpation of the mid thoracic area. No gross neurovascular deficits appreciated on examination. It is believed that the patient has thoracic area back pain/strain. Patient is to follow-up with orthopedics. Prescription for Robaxin and Norco given to the patient.    Final diagnoses:  Thoracic back pain, unspecified back pain laterality    *I personally performed the services described in this documentation, which was scribed in my presence. The recorded information has been reviewed and is accurate.**  **I personally performed the services described in this documentation, which was scribed in my presence. The recorded information has been reviewed and is accurate.Ivery Quale*   Jasslyn Finkel, PA-C 06/20/15 1705  Zadie Rhineonald Wickline, MD 06/21/15 [phone]

## 2015-06-19 NOTE — Discharge Instructions (Signed)
Please keep the appointment with your primary physician on tomorrow. Please discuss your back issue, and possibility of MRI. Robaxin and Norco may cause drowsiness, please use with caution. Back Pain, Adult Back pain is very common. The pain often gets better over time. The cause of back pain is usually not dangerous. Most people can learn to manage their back pain on their own.  HOME CARE  Watch your back pain for any changes. The following actions may help to lessen any pain you are feeling:  Stay active. Start with short walks on flat ground if you can. Try to walk farther each day.  Exercise regularly as told by your doctor. Exercise helps your back heal faster. It also helps avoid future injury by keeping your muscles strong and flexible.  Do not sit, drive, or stand in one place for more than 30 minutes.  Do not stay in bed. Resting more than 1-2 days can slow down your recovery.  Be careful when you bend or lift an object. Use good form when lifting:  Bend at your knees.  Keep the object close to your body.  Do not twist.  Sleep on a firm mattress. Lie on your side, and bend your knees. If you lie on your back, put a pillow under your knees.  Take medicines only as told by your doctor.  Put ice on the injured area.  Put ice in a plastic bag.  Place a towel between your skin and the bag.  Leave the ice on for 20 minutes, 2-3 times a day for the first 2-3 days. After that, you can switch between ice and heat packs.  Avoid feeling anxious or stressed. Find good ways to deal with stress, such as exercise.  Maintain a healthy weight. Extra weight puts stress on your back. GET HELP IF:   You have pain that does not go away with rest or medicine.  You have worsening pain that goes down into your legs or buttocks.  You have pain that does not get better in one week.  You have pain at night.  You lose weight.  You have a fever or chills. GET HELP RIGHT AWAY IF:   You  cannot control when you poop (bowel movement) or pee (urinate).  Your arms or legs feel weak.  Your arms or legs lose feeling (numbness).  You feel sick to your stomach (nauseous) or throw up (vomit).  You have belly (abdominal) pain.  You feel like you may pass out (faint).   This information is not intended to replace advice given to you by your health care provider. Make sure you discuss any questions you have with your health care provider.   Document Released: 08/28/2007 Document Revised: 04/01/2014 Document Reviewed: 07/13/2013 Elsevier Interactive Patient Education [employer]2016 Elsevier Inc.

## 2015-06-20 ENCOUNTER — Encounter: Payer: Self-pay | Admitting: Family Medicine

## 2015-06-20 ENCOUNTER — Ambulatory Visit (INDEPENDENT_AMBULATORY_CARE_PROVIDER_SITE_OTHER): Payer: Self-pay | Admitting: Family Medicine

## 2015-06-20 VITALS — BP 119/73 | HR 78 | Temp 97.5°F | Ht 69.0 in | Wt 199.8 lb

## 2015-06-20 DIAGNOSIS — M546 Pain in thoracic spine: Secondary | ICD-10-CM

## 2015-06-20 MED ORDER — METHOCARBAMOL 500 MG PO TABS: 500.0000 mg | ORAL_TABLET | Freq: Three times a day (TID) | ORAL | Status: DC

## 2015-06-20 MED ORDER — TRAMADOL HCL 50 MG PO TABS: 50.0000 mg | ORAL_TABLET | Freq: Four times a day (QID) | ORAL | Status: DC | PRN

## 2015-06-20 NOTE — Progress Notes (Signed)
HPI  Patient presents today for thoracic back pain.  Patient's lines that over the last 2 months he's had thoracic back pain described as sharp and stabbing in nature, it hurts worse with lying down, also whenever he comes down at night, while he is working he doesn't have as many problems. He denies any arm weakness, numbness, tingling, leg weakness, bowel or bladder dysfunction, or saddle anesthesia.  He denies any injury of the onset of this. In review his records has been seen in the ER several times that had no primary care. [facility], he had a CAT scan at that time which ruled out kidney stone. This is listed below.  He denies fever, chills, abdominal pain, diarrhea, nausea, vomiting, shortness of breath, or any other signs of illness.  He states that he is taking 6 hydrocodone in the last 18 hours or so since leaving the emergency room. He requests the medicine that I gave him for a wrist injury previously, this was tramadol  PMH: Smoking status noted His past medical, surgical, social, family history reviewed and updated in EMR ROS: Per HPI  Objective: BP 119/73 mmHg  Pulse 78  Temp(Src) 97.5 F (36.4 C) (Oral)  Ht 5\' 9"  (1.753 m)  Wt 199 lb 12.8 oz (90.629 kg)  BMI 29.49 kg/m2 Gen: NAD, alert, cooperative with exam HEENT: NCAT, TMs normal bilaterally, nares clear, oropharynx clear CV: RRR, good S1/S2, no murmur Resp: CTABL, no wheezes, non-labored Abd: SNTND, BS present, no guarding or organomegaly Ext: No edema, warm Neuro: Alert and oriented, No gross deficits  MSK:  paraspinal muscle tenderness between shoulder blades, mild thoracic spine tenderness to palpation in the similar area, no rash, he does have some mild erythema of the skin consistent with possible developing erythema abigne (heat pad burn)   [facility]  IMPRESSION: Vertebral body heights maintained.  No acute  fracture.  No subluxation. Minimal levoscoliosis lower thoracic spine.  No significant degenerative changes        Result Narrative  Exam date/time:  01/29/2015 5:48 PM INDICATION: Backache   TECHNIQUE:  XR THORACIC SPINE AP AND LATERAL  COMPARISON:  None.  FINDINGS: See below.    IMPRESSION: Negative acute.   04/17/2015 - CT abd pelv  Result Narrative  INDICATION:  Flank pain    TECHNIQUE:  Routine CT of the abdomen and pelvis was performed without intravenous or oral contrast per renal calculus evaluation protocol. Evaluation potential of major abdominal viscera and vascular structures is somewhat reduced in the absence of contrast. This  exam is intended to evaluate for presence of renal calculi and does not adequately assess for renal or urothelial malignancies.  FINDINGS:  NO acute abnormality in the liver, gallbladder, pancreas, spleen, or adrenal glands. NO acute abnormality of the GI tract (no obstruction, colitis/enteritis etc.). NO pneumatosis, pneumoperitoneum, or hemoperitoneum.  NO appendicitis (the appendix is well seen).  NO acute abnormality of the kidneys (no urolithiasis, mass, hydronephrosis etc.). NO abnormality of the urinary bladder.  NO acute osseous lesion.   Result Impression  IMPRESSION: Vertebral body heights maintained.  No acute fracture.  No subluxation.      No significant degenerative changes        Result Narrative  Exam date/time:  01/29/2015 5:48 PM INDICATION: Fall   TECHNIQUE:  XR LUMBAR SPINE AP AND LATERAL  COMPARISON:  None.  FINDINGS: See below.   04/17/2015 showed WBC 5.0,  hemoglobin 16.9, urine negative for blood, nitrites, and leukocyte esterase AST 15, ALC 20, creatinine 1.0, sodium 143, chloride 104, CO2 27  Assessment and plan:  # thoracic back pain C/w MSK back pain Discussed with him that narcotics are not a goo dlong term solution, encouraged exercises, givenb handout from sports medicine patient advisor  for upper back pain Tramadol Robaxin Discussed appropriate NDSAID use and dosing Imaging and labs reviewed and I don't think any additional are needed Offered elavil which he declined  No tramadol refill without return visit. - discussed   Meds ordered this encounter  Medications  . traMADol (ULTRAM) 50 MG tablet    Sig: Take 1 tablet (50 mg total) by mouth every 6 (six) hours as needed.    Dispense:  30 tablet    Refill:  0  . methocarbamol (ROBAXIN) 500 MG tablet    Sig: Take 1 tablet (500 mg total) by mouth 3 (three) times daily.    Dispense:  30 tablet    Refill:  1    Murtis Sink, MD [facility] 06/20/2015, 8:36 AM

## 2015-06-20 NOTE — Patient Instructions (Signed)
Great to meet you!  Try the exercises in the hadnout  Use tramadol sparingly  Do not take more than 800 mg ibupofen 3 times a day  You will have to be seen if you need a refill of tramadol as it is a controlled substance, it can make you sleepy so do not drive after you take it.

## 2015-07-17 ENCOUNTER — Encounter: Payer: Self-pay | Admitting: Family Medicine

## 2015-07-17 ENCOUNTER — Emergency Department (HOSPITAL_COMMUNITY)
Admission: EM | Admit: 2015-07-17 | Discharge: 2015-07-18 | Disposition: A | Discharge status: Home / Self Care | Payer: Self-pay | Attending: Emergency Medicine | Admitting: Emergency Medicine

## 2015-07-17 ENCOUNTER — Ambulatory Visit (INDEPENDENT_AMBULATORY_CARE_PROVIDER_SITE_OTHER): Payer: Self-pay

## 2015-07-17 ENCOUNTER — Ambulatory Visit (INDEPENDENT_AMBULATORY_CARE_PROVIDER_SITE_OTHER): Payer: Self-pay | Admitting: Family Medicine

## 2015-07-17 ENCOUNTER — Encounter (HOSPITAL_COMMUNITY): Payer: Self-pay | Admitting: Emergency Medicine

## 2015-07-17 VITALS — BP 116/71 | HR 73 | Temp 97.4°F | Ht 69.0 in | Wt 199.0 lb

## 2015-07-17 DIAGNOSIS — S8992XA Unspecified injury of left lower leg, initial encounter: Secondary | ICD-10-CM | POA: Insufficient documentation

## 2015-07-17 DIAGNOSIS — M545 Low back pain: Secondary | ICD-10-CM

## 2015-07-17 DIAGNOSIS — Z79899 Other long term (current) drug therapy: Secondary | ICD-10-CM | POA: Insufficient documentation

## 2015-07-17 DIAGNOSIS — S8991XA Unspecified injury of right lower leg, initial encounter: Secondary | ICD-10-CM | POA: Insufficient documentation

## 2015-07-17 DIAGNOSIS — Y999 Unspecified external cause status: Secondary | ICD-10-CM | POA: Insufficient documentation

## 2015-07-17 DIAGNOSIS — S8000XA Contusion of unspecified knee, initial encounter: Secondary | ICD-10-CM

## 2015-07-17 DIAGNOSIS — M25562 Pain in left knee: Secondary | ICD-10-CM

## 2015-07-17 DIAGNOSIS — M25561 Pain in right knee: Secondary | ICD-10-CM

## 2015-07-17 DIAGNOSIS — Z87891 Personal history of nicotine dependence: Secondary | ICD-10-CM | POA: Insufficient documentation

## 2015-07-17 DIAGNOSIS — Y939 Activity, unspecified: Secondary | ICD-10-CM | POA: Insufficient documentation

## 2015-07-17 DIAGNOSIS — W1789XA Other fall from one level to another, initial encounter: Secondary | ICD-10-CM | POA: Insufficient documentation

## 2015-07-17 DIAGNOSIS — Y929 Unspecified place or not applicable: Secondary | ICD-10-CM | POA: Insufficient documentation

## 2015-07-17 MED ORDER — IBUPROFEN 800 MG PO TABS
800.0000 mg | ORAL_TABLET | Freq: Once | ORAL | Status: AC
  Administered 2015-07-18: 800 mg via ORAL
  Filled 2015-07-17: qty 1

## 2015-07-17 MED ORDER — IBUPROFEN 800 MG PO TABS
800.0000 mg | ORAL_TABLET | Freq: Three times a day (TID) | ORAL | Status: DC
Start: 2015-07-17 — End: 2015-09-09

## 2015-07-17 MED ORDER — ACETAMINOPHEN 325 MG PO TABS
650.0000 mg | ORAL_TABLET | Freq: Once | ORAL | Status: AC
  Administered 2015-07-18: 650 mg via ORAL
  Filled 2015-07-17: qty 2

## 2015-07-17 MED ORDER — PREDNISONE 20 MG PO TABS: ORAL_TABLET | ORAL | Status: DC

## 2015-07-17 MED ORDER — MELOXICAM 15 MG PO TABS: 15.0000 mg | ORAL_TABLET | Freq: Every day | ORAL | Status: DC

## 2015-07-17 NOTE — ED Notes (Signed)
Pt c/o bilateral knee pain after falling on them earlier today.

## 2015-07-17 NOTE — ED Provider Notes (Signed)
CSN: [id number]     Arrival date & time 07/17/15  2243 History   First MD Initiated Contact with Patient 07/17/15 2253     Chief Complaint  Patient presents with  . Knee Pain     (Consider location/radiation/quality/duration/timing/severity/associated sxs/prior Treatment) Patient is a 25 y.o. male presenting with knee pain. The history is provided by the patient.  Knee Pain Location:  Knee Injury: yes   Mechanism of injury: fall   Fall:    Fall occurred: Pt fell off a pick up truck. Knee location:  L knee and R knee Pain details:    Duration:  5 hours   Timing:  Constant Chronicity:  New Prior injury to area:  No Relieved by:  Nothing Worsened by:  Flexion and extension Ineffective treatments:  None tried Associated symptoms: back pain   Associated symptoms: no neck pain, no numbness, no swelling and no tingling   Risk factors: no frequent fractures     History reviewed. No pertinent past medical history. Past Surgical History  Procedure Laterality Date  . Wisdom tooth extraction     No family history on file. Social History  Substance Use Topics  . Smoking status: Former Smoker -- 1.00 packs/day for 8 years    Types: Cigarettes    Quit date: 04/29/2015  . Smokeless tobacco: None  . Alcohol Use: No    Review of Systems  Constitutional: Negative for activity change.       All ROS Neg except as noted in HPI  HENT: Negative for nosebleeds.   Eyes: Negative for photophobia and discharge.  Respiratory: Negative for cough, shortness of breath and wheezing.   Cardiovascular: Negative for chest pain and palpitations.  Gastrointestinal: Negative for abdominal pain and blood in stool.  Genitourinary: Negative for dysuria, frequency and hematuria.  Musculoskeletal: Positive for back pain and arthralgias. Negative for neck pain.  Skin: Negative.   Neurological: Negative for dizziness, seizures and speech difficulty.  Psychiatric/Behavioral: Negative for hallucinations  and confusion.  All other systems reviewed and are negative.     Allergies  Review of patient's allergies indicates no known allergies.  Home Medications   Prior to Admission medications   Medication Sig Start Date End Date Taking? Authorizing Provider  ibuprofen (ADVIL,MOTRIN) 200 MG tablet Take 800 mg by mouth every 6 (six) hours as needed for mild pain or moderate pain.    Yes Historical Provider, MD  meloxicam (MOBIC) 15 MG tablet Take 1 tablet (15 mg total) by mouth daily. 07/17/15   Elige Radon Dettinger, MD  methocarbamol (ROBAXIN) 500 MG tablet Take 1 tablet (500 mg total) by mouth 3 (three) times daily. Patient not taking: Reported on 07/17/2015 06/20/15   Elenora Gamma, MD  predniSONE (DELTASONE) 20 MG tablet 2 po at same time daily for 5 days 07/17/15   Elige Radon Dettinger, MD  traMADol (ULTRAM) 50 MG tablet Take 1 tablet (50 mg total) by mouth every 6 (six) hours as needed. Patient not taking: Reported on 07/17/2015 06/20/15   Elenora Gamma, MD   BP 108/61 mmHg  Pulse 60  Temp(Src) 97.9 F (36.6 C)  Resp 20  Ht  (1.753 m)  Wt 86.183 kg  BMI 28.05 kg/m2  SpO2 100% Physical Exam  Constitutional: He is oriented to person, place, and time. He appears well-developed and well-nourished.  Non-toxic appearance.  HENT:  Head: Normocephalic.  Right Ear: Tympanic membrane and external ear normal.  Left Ear: Tympanic membrane and external ear normal.  Eyes: EOM and lids are normal. Pupils are equal, round, and reactive to light.  Neck: Normal range of motion. Neck supple. Carotid bruit is not present.  Cardiovascular: Normal rate, regular rhythm, normal heart sounds, intact distal pulses and normal pulses.   Pulmonary/Chest: Breath sounds normal. No respiratory distress.  Abdominal: Soft. Bowel sounds are normal. There is no tenderness. There is no guarding.  Musculoskeletal: Normal range of motion.       Right knee: He exhibits normal range of motion, no effusion, no  ecchymosis and no deformity. Tenderness found. Lateral joint line tenderness noted.       Left knee: He exhibits no swelling, no effusion, no ecchymosis and no deformity. Tenderness found. Lateral joint line tenderness noted. No patellar tendon tenderness noted.  Lymphadenopathy:       Head (right side): No submandibular adenopathy present.       Head (left side): No submandibular adenopathy present.    He has no cervical adenopathy.  Neurological: He is alert and oriented to person, place, and time. He has normal strength. No cranial nerve deficit or sensory deficit.  Skin: Skin is warm and dry.  Psychiatric: He has a normal mood and affect. His speech is normal.  Nursing note and vitals reviewed.   ED Course  Procedures (including critical care time) Labs Review Labs Reviewed - No data to display  Imaging Review Dg Lumbar Spine 2-3 Views  07/17/2015  CLINICAL DATA:  Low back pain. EXAM: LUMBAR SPINE - 2-3 VIEW COMPARISON:  No recent prior. FINDINGS: No acute soft tissue bony abnormality identified. Normal alignment mineralization. IMPRESSION: No acute abnormality . Electronically Signed   By: Maisie Fushomas  Register   On: 07/17/2015 16:57   I have personally reviewed and evaluated these images and lab results as part of my medical decision-making.   EKG Interpretation None      MDM  Patient states that approximately 4 or 5 PM he fell out of a truck he today. He injured the right and left knee after falling. 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 He is able to ambulate. He denies any hip or pelvis area pain or discomfort.  There is no effusion appreciated. No significant hematoma. Patient has good range of motion of both knees, but with some tenderness.  Suspect contusion of both knees. Patient will be prescribed anti-inflammatory pain medication. He will be asked to see orthopedics if not improving.    Final diagnoses:  None    *I have reviewed nursing  notes, vital signs, and all appropriate lab and imaging results for this patient.[address]**    Neill Jurewicz, PA-C 07/17/15 2350  Benjiman CoreNathan Pickering, MD 07/18/15 [phone]

## 2015-07-17 NOTE — Discharge Instructions (Signed)
Your examination is consistent with contusion and possible mild strain of the knees. Please rest her knees is much as possible. Use ibuprofen 3 times daily. May use Tylenol in between the ibuprofen doses if needed for soreness. Contusion A contusion is a deep bruise. Contusions happen when an injury causes bleeding under the skin. Symptoms of bruising include pain, swelling, and discolored skin. The skin may turn blue, purple, or yellow. HOME CARE   Rest the injured area.  If told, put ice on the injured area.  Put ice in a plastic bag.  Place a towel between your skin and the bag.  Leave the ice on for 20 minutes, 2-3 times per day.  If told, put light pressure (compression) on the injured area using an elastic bandage. Make sure the bandage is not too tight. Remove it and put it back on as told by your doctor.  If possible, raise (elevate) the injured area above the level of your heart while you are sitting or lying down.  Take over-the-counter and prescription medicines only as told by your doctor. GET HELP IF:  Your symptoms do not get better after several days of treatment.  Your symptoms get worse.  You have trouble moving the injured area. GET HELP RIGHT AWAY IF:   You have very bad pain.  You have a loss of feeling (numbness) in a hand or foot.  Your hand or foot turns pale or cold.   This information is not intended to replace advice given to you by your health care provider. Make sure you discuss any questions you have with your health care provider.   Document Released: 08/28/2007 Document Revised: 11/30/2014 Document Reviewed: 07/27/2014 Elsevier Interactive Patient Education [employer]2016 [employer] Inc.

## 2015-07-17 NOTE — Progress Notes (Signed)
BP 116/71 mmHg  Pulse 73  Temp(Src) 97.4 F (36.3 C) (Oral)  Ht 5\' 9"  (1.753 m)  Wt 199 lb (90.266 kg)  BMI 29.37 kg/m2   Subjective:    Patient ID: Jonathon Wright, male    DOB: 1991-03-01, 25 y.o.   MRN: [id number]  HPI: Jonathon Wright is a 25 y.o. male presenting on 07/17/2015 for Back Pain and bilateral knee pain   HPI Low back pain Patient has low back pain that is bilateral and sometimes has radiation down the right side of his leg. The pain has been present since he had an accident where he fell off a machine and landed on his buttocks at work a few years ago. The pain is bilateral but worse on the right. He has tried Robaxin for this and then has been taking tramadol which helped some. He denies any numbness or weakness in either leg. He denies any overlying skin changes.  Bilateral knee pain Patient has been having bilateral knee pain that it's been worse in the right than the left. The knee pain is anterior over the upper part of the tibia. He denies any giving out or popping or catching in the leg. Sometimes does get some swelling in the but no redness or warmth. He was concerned about whether or not he might have gout. She currently does not have as much swelling in those knees.  Relevant past medical, surgical, family and social history reviewed and updated as indicated. Interim medical history since our last visit reviewed. Allergies and medications reviewed and updated.  Review of Systems  Constitutional: Negative for fever and chills.  HENT: Negative for ear discharge and ear pain.   Eyes: Negative for discharge and visual disturbance.  Respiratory: Negative for shortness of breath and wheezing.   Cardiovascular: Negative for chest pain and leg swelling.  Gastrointestinal: Negative for abdominal pain, diarrhea and constipation.  Genitourinary: Negative for difficulty urinating.  Musculoskeletal: Positive for myalgias, back pain, joint swelling and arthralgias. Negative  for gait problem.  Skin: Negative for rash.  Neurological: Negative for syncope, light-headedness and headaches.  All other systems reviewed and are negative.   Per HPI unless specifically indicated above     Medication List       This list is accurate as of: 07/17/15  5:24 PM.  Always use your most recent med list.               ibuprofen 200 MG tablet  Commonly known as:  ADVIL,MOTRIN  Take 800 mg by mouth every 6 (six) hours as needed for mild pain or moderate pain.     meloxicam 15 MG tablet  Commonly known as:  MOBIC  Take 1 tablet (15 mg total) by mouth daily.     methocarbamol 500 MG tablet  Commonly known as:  ROBAXIN  Take 1 tablet (500 mg total) by mouth 3 (three) times daily.     predniSONE 20 MG tablet  Commonly known as:  DELTASONE  2 po at same time daily for 5 days     traMADol 50 MG tablet  Commonly known as:  ULTRAM  Take 1 tablet (50 mg total) by mouth every 6 (six) hours as needed.           Objective:    BP 116/71 mmHg  Pulse 73  Temp(Src) 97.4 F (36.3 C) (Oral)  Ht 5\' 9"  (1.753 m)  Wt 199 lb (90.266 kg)  BMI 29.37 kg/m2  Wt Readings from  Last 3 Encounters:  07/17/15 199 lb (90.266 kg)  06/20/15 199 lb 12.8 oz (90.629 kg)  06/19/15 190 lb (86.183 kg)    Physical Exam  Constitutional: He is oriented to person, place, and time. He appears well-developed and well-nourished. No distress.  Eyes: Conjunctivae and EOM are normal. Pupils are equal, round, and reactive to light. Right eye exhibits no discharge. No scleral icterus.  Cardiovascular: Normal rate, regular rhythm, normal heart sounds and intact distal pulses.   No murmur heard. Pulmonary/Chest: Effort normal and breath sounds normal. No respiratory distress. He has no wheezes.  Musculoskeletal: Normal range of motion. He exhibits no edema.       Lumbar back: He exhibits tenderness (Paraspinal tenderness, negative straight leg raise) and spasm. He exhibits normal range of motion,  no bony tenderness and no swelling.  Bilateral tenderness over the tibial tuberosity. No decreased range of motion or clicking or popping.  Neurological: He is alert and oriented to person, place, and time. Coordination normal.  Skin: Skin is warm and dry. No rash noted. He is not diaphoretic.  Psychiatric: He has a normal mood and affect. His behavior is normal.  Vitals reviewed.   Results for orders placed or performed during the hospital encounter of 05/27/15  Comprehensive metabolic panel  Result Value Ref Range   Sodium 138 135 - 145 mmol/L   Potassium 4.1 3.5 - 5.1 mmol/L   Chloride 105 101 - 111 mmol/L   CO2 26 22 - 32 mmol/L   Glucose, Bld 98 65 - 99 mg/dL   BUN 23 (H) 6 - 20 mg/dL   Creatinine, Ser 1.61 0.61 - 1.24 mg/dL   Calcium 9.0 8.9 - 09.6 mg/dL   Total Protein 6.9 6.5 - 8.1 g/dL   Albumin 4.4 3.5 - 5.0 g/dL   AST 16 15 - 41 U/L   ALT 16 (L) 17 - 63 U/L   Alkaline Phosphatase 50 38 - 126 U/L   Total Bilirubin 0.5 0.3 - 1.2 mg/dL   GFR calc non Af Amer >60 >60 mL/min   GFR calc Af Amer >60 >60 mL/min   Anion gap 7 5 - 15  CBC with Differential  Result Value Ref Range   WBC 7.2 4.0 - 10.5 K/uL   RBC 4.66 4.22 - 5.81 MIL/uL   Hemoglobin 14.7 13.0 - 17.0 g/dL   HCT 04.5 40.9 - 81.1 %   MCV 88.0 78.0 - 100.0 fL   MCH 31.5 26.0 - 34.0 pg   MCHC 35.9 30.0 - 36.0 g/dL   RDW 91.4 78.2 - 95.6 %   Platelets 221 150 - 400 K/uL   Neutrophils Relative % 60 %   Neutro Abs 4.3 1.7 - 7.7 K/uL   Lymphocytes Relative 30 %   Lymphs Abs 2.2 0.7 - 4.0 K/uL   Monocytes Relative 8 %   Monocytes Absolute 0.6 0.1 - 1.0 K/uL   Eosinophils Relative 1 %   Eosinophils Absolute 0.1 0.0 - 0.7 K/uL   Basophils Relative 1 %   Basophils Absolute 0.0 0.0 - 0.1 K/uL  Troponin I  Result Value Ref Range   Troponin I <0.03 <0.031 ng/mL      Assessment & Plan:   Problem List Items Addressed This Visit    None    Visit Diagnoses    Bilateral knee pain    -  Primary    Likely  prepatellar bursitis or tendinitis, prednisone and Mobic    Relevant Medications  predniSONE (DELTASONE) 20 MG tablet    meloxicam (MOBIC) 15 MG tablet    Other Relevant Orders    Uric acid (Completed)    Low back pain, unspecified back pain laterality, with sciatica presence unspecified        Relevant Medications    predniSONE (DELTASONE) 20 MG tablet    meloxicam (MOBIC) 15 MG tablet    Other Relevant Orders    DG Lumbar Spine 2-3 Views       Follow up plan: Return if symptoms worsen or fail to improve.  Counseling provided for all of the vaccine components Orders Placed This Encounter  Procedures  . DG Lumbar Spine 2-3 Views  . Uric acid    Arville Care, MD [facility] 07/17/2015, 5:24 PM

## 2015-07-17 NOTE — ED Notes (Signed)
PA at the bedside.

## 2015-07-18 LAB — URIC ACID: URIC ACID: 4.9 mg/dL (ref 3.7–8.6)

## 2015-08-25 ENCOUNTER — Telehealth (HOSPITAL_BASED_OUTPATIENT_CLINIC_OR_DEPARTMENT_OTHER): Payer: Self-pay

## 2015-08-25 ENCOUNTER — Emergency Department (HOSPITAL_COMMUNITY)
Admission: EM | Admit: 2015-08-25 | Discharge: 2015-08-25 | Disposition: A | Discharge status: Home / Self Care | Payer: BLUE CROSS/BLUE SHIELD | Attending: Emergency Medicine | Admitting: Emergency Medicine

## 2015-08-25 ENCOUNTER — Emergency Department (HOSPITAL_COMMUNITY): Payer: BLUE CROSS/BLUE SHIELD

## 2015-08-25 ENCOUNTER — Encounter (HOSPITAL_COMMUNITY): Payer: Self-pay | Admitting: *Deleted

## 2015-08-25 DIAGNOSIS — Z87891 Personal history of nicotine dependence: Secondary | ICD-10-CM | POA: Insufficient documentation

## 2015-08-25 DIAGNOSIS — Y929 Unspecified place or not applicable: Secondary | ICD-10-CM | POA: Diagnosis not present

## 2015-08-25 DIAGNOSIS — Y939 Activity, unspecified: Secondary | ICD-10-CM | POA: Insufficient documentation

## 2015-08-25 DIAGNOSIS — M79641 Pain in right hand: Secondary | ICD-10-CM | POA: Diagnosis present

## 2015-08-25 DIAGNOSIS — S61411A Laceration without foreign body of right hand, initial encounter: Secondary | ICD-10-CM

## 2015-08-25 DIAGNOSIS — W268XXA Contact with other sharp object(s), not elsewhere classified, initial encounter: Secondary | ICD-10-CM | POA: Insufficient documentation

## 2015-08-25 DIAGNOSIS — Y999 Unspecified external cause status: Secondary | ICD-10-CM | POA: Insufficient documentation

## 2015-08-25 DIAGNOSIS — L089 Local infection of the skin and subcutaneous tissue, unspecified: Secondary | ICD-10-CM | POA: Diagnosis not present

## 2015-08-25 DIAGNOSIS — S61212A Laceration without foreign body of right middle finger without damage to nail, initial encounter: Secondary | ICD-10-CM | POA: Insufficient documentation

## 2015-08-25 MED ORDER — SULFAMETHOXAZOLE-TRIMETHOPRIM 800-160 MG PO TABS: 1.0000 | ORAL_TABLET | Freq: Three times a day (TID) | ORAL | Status: DC

## 2015-08-25 MED ORDER — SULFAMETHOXAZOLE-TRIMETHOPRIM 800-160 MG PO TABS
1.0000 | ORAL_TABLET | Freq: Once | ORAL | Status: AC
  Administered 2015-08-25: 1 via ORAL
  Filled 2015-08-25: qty 1

## 2015-08-25 MED ORDER — CEPHALEXIN 500 MG PO CAPS
500.0000 mg | ORAL_CAPSULE | Freq: Once | ORAL | Status: AC
  Administered 2015-08-25: 500 mg via ORAL
  Filled 2015-08-25: qty 1

## 2015-08-25 MED ORDER — CEPHALEXIN 500 MG PO CAPS: 500.0000 mg | ORAL_CAPSULE | Freq: Four times a day (QID) | ORAL | Status: DC
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
## 2015-08-25 NOTE — Discharge Instructions (Signed)
Follow-up with Dr. Dion SaucierLandau at 10:30 this morning.  Laceration Care, Adult A laceration is a cut that goes through all of the layers of the skin and into the tissue that is right under the skin. Some lacerations heal on their own. Others need to be closed with stitches (sutures), staples, skin adhesive strips, or skin glue. Proper laceration care minimizes the risk of infection and helps the laceration to heal better. HOW TO CARE FOR YOUR LACERATION If sutures or staples were used:  Keep the wound clean and dry.  If you were given a bandage (dressing), you should change it at least one time per day or as told by your health care provider. You should also change it if it becomes wet or dirty.  Keep the wound completely dry for the first 24 hours or as told by your health care provider. After that time, you may shower or bathe. However, make sure that the wound is not soaked in water until after the sutures or staples have been removed.  Clean the wound one time each day or as told by your health care provider:  Wash the wound with soap and water.  Rinse the wound with water to remove all soap.  Pat the wound dry with a clean towel. Do not rub the wound.  After cleaning the wound, apply a thin layer of antibiotic ointmentas told by your health care provider. This will help to prevent infection and keep the dressing from sticking to the wound.  Have the sutures or staples removed as told by your health care provider. If skin adhesive strips were used:  Keep the wound clean and dry.  If you were given a bandage (dressing), you should change it at least one time per day or as told by your health care provider. You should also change it if it becomes dirty or wet.  Do not get the skin adhesive strips wet. You may shower or bathe, but be careful to keep the wound dry.  If the wound gets wet, pat it dry with a clean towel. Do not rub the wound.  Skin adhesive strips fall off on their own.  You may trim the strips as the wound heals. Do not remove skin adhesive strips that are still stuck to the wound. They will fall off in time. If skin glue was used:  Try to keep the wound dry, but you may briefly wet it in the shower or bath. Do not soak the wound in water, such as by swimming.  After you have showered or bathed, gently pat the wound dry with a clean towel. Do not rub the wound.  Do not do any activities that will make you sweat heavily until the skin glue has fallen off on its own.  Do not apply liquid, cream, or ointment medicine to the wound while the skin glue is in place. Using those may loosen the film before the wound has healed.  If you were given a bandage (dressing), you should change it at least one time per day or as told by your health care provider. You should also change it if it becomes dirty or wet.  If a dressing is placed over the wound, be careful not to apply tape directly over the skin glue. Doing that may cause the glue to be pulled off before the wound has healed.  Do not pick at the glue. The skin glue usually remains in place for 5-10 days, then it falls off of  the skin. General Instructions  Take over-the-counter and prescription medicines only as told by your health care provider.  If you were prescribed an antibiotic medicine or ointment, take or apply it as told by your doctor. Do not stop using it even if your condition improves.  To help prevent scarring, make sure to cover your wound with sunscreen whenever you are outside after stitches are removed, after adhesive strips are removed, or when glue remains in place and the wound is healed. Make sure to wear a sunscreen of at least 30 SPF.  Do not scratch or pick at the wound.  Keep all follow-up visits as told by your health care provider. This is important.  Check your wound every day for signs of infection. Watch for:  Redness, swelling, or pain.  Fluid, blood, or pus.  Raise  (elevate) the injured area above the level of your heart while you are sitting or lying down, if possible. SEEK MEDICAL CARE IF:  You received a tetanus shot and you have swelling, severe pain, redness, or bleeding at the injection site.  You have a fever.  A wound that was closed breaks open.  You notice a bad smell coming from your wound or your dressing.  You notice something coming out of the wound, such as wood or glass.  Your pain is not controlled with medicine.  You have increased redness, swelling, or pain at the site of your wound.  You have fluid, blood, or pus coming from your wound.  You notice a change in the color of your skin near your wound.  You need to change the dressing frequently due to fluid, blood, or pus draining from the wound.  You develop a new rash.  You develop numbness around the wound. SEEK IMMEDIATE MEDICAL CARE IF:  You develop severe swelling around the wound.  Your pain suddenly increases and is severe.  You develop painful lumps near the wound or on skin that is anywhere on your body.  You have a red streak going away from your wound.  The wound is on your hand or foot and you cannot properly move a finger or toe.  The wound is on your hand or foot and you notice that your fingers or toes look pale or bluish.   This information is not intended to replace advice given to you by your health care provider. Make sure you discuss any questions you have with your health care provider.   Document Released: 03/11/2005 Document Revised: 07/26/2014 Document Reviewed: 03/07/2014 Elsevier Interactive Patient Education [employer]

## 2015-08-25 NOTE — ED Notes (Signed)
Pt c/o pain and swelling to right hand; pt states he punched a mirror a couple of days ago; pt has small laceration to middle knuckle

## 2015-09-09 ENCOUNTER — Encounter (HOSPITAL_COMMUNITY): Payer: Self-pay | Admitting: Emergency Medicine

## 2015-09-09 ENCOUNTER — Emergency Department (HOSPITAL_COMMUNITY)
Admission: EM | Admit: 2015-09-09 | Discharge: 2015-09-09 | Disposition: A | Discharge status: Home / Self Care | Payer: BLUE CROSS/BLUE SHIELD | Attending: Emergency Medicine | Admitting: Emergency Medicine

## 2015-09-09 DIAGNOSIS — K0510 Chronic gingivitis, plaque induced: Secondary | ICD-10-CM | POA: Diagnosis not present

## 2015-09-09 DIAGNOSIS — K047 Periapical abscess without sinus: Secondary | ICD-10-CM | POA: Diagnosis not present

## 2015-09-09 DIAGNOSIS — Z87891 Personal history of nicotine dependence: Secondary | ICD-10-CM | POA: Insufficient documentation

## 2015-09-09 DIAGNOSIS — K0889 Other specified disorders of teeth and supporting structures: Secondary | ICD-10-CM | POA: Diagnosis present

## 2015-09-09 MED ORDER — AMOXICILLIN 250 MG PO CAPS
500.0000 mg | ORAL_CAPSULE | Freq: Once | ORAL | Status: AC
  Administered 2015-09-09: 500 mg via ORAL
  Filled 2015-09-09: qty 2

## 2015-09-09 MED ORDER — AMOXICILLIN 500 MG PO CAPS: 500.0000 mg | ORAL_CAPSULE | Freq: Three times a day (TID) | ORAL | Status: DC

## 2015-09-09 MED ORDER — OXYCODONE-ACETAMINOPHEN 5-325 MG PO TABS
1.0000 | ORAL_TABLET | Freq: Once | ORAL | Status: AC
  Administered 2015-09-09: 1 via ORAL
  Filled 2015-09-09: qty 1

## 2015-09-09 MED ORDER — HYDROCODONE-ACETAMINOPHEN 5-325 MG PO TABS: 1.0000 | ORAL_TABLET | ORAL | Status: DC | PRN

## 2015-09-09 NOTE — Discharge Instructions (Signed)
Dental Abscess °A dental abscess is a collection of pus in or around a tooth. °CAUSES °This condition is caused by a bacterial infection around the root of the tooth that involves the inner part of the tooth (pulp). It may result from: °· Severe tooth decay. °· Trauma to the tooth that allows bacteria to enter into the pulp, such as a broken or chipped tooth. °· Severe gum disease around a tooth. °SYMPTOMS °Symptoms of this condition include: °· Severe pain in and around the infected tooth. °· Swelling and redness around the infected tooth, in the mouth, or in the face. °· Tenderness. °· Pus drainage. °· Bad breath. °· Bitter taste in the mouth. °· Difficulty swallowing. °· Difficulty opening the mouth. °· Nausea. °· Vomiting. °· Chills. °· Swollen neck glands. °· Fever. °DIAGNOSIS °This condition is diagnosed with examination of the infected tooth. During the exam, your dentist may tap on the infected tooth. Your dentist will also ask about your medical and dental history and may order X-rays. °TREATMENT °This condition is treated by eliminating the infection. This may be done with: °· Antibiotic medicine. °· A root canal. This may be performed to save the tooth. °· Pulling (extracting) the tooth. This may also involve draining the abscess. This is done if the tooth cannot be saved. °HOME CARE INSTRUCTIONS °· Take medicines only as directed by your dentist. °· If you were prescribed antibiotic medicine, finish all of it even if you start to feel better. °· Rinse your mouth (gargle) often with salt water to relieve pain or swelling. °· Do not drive or operate heavy machinery while taking pain medicine. °· Do not apply heat to the outside of your mouth. °· Keep all follow-up visits as directed by your dentist. This is important. °SEEK MEDICAL CARE IF: °· Your pain is worse and is not helped by medicine. °SEEK IMMEDIATE MEDICAL CARE IF: °· You have a fever or chills. °· Your symptoms suddenly get worse. °· You have a  very bad headache. °· You have problems breathing or swallowing. °· You have trouble opening your mouth. °· You have swelling in your neck or around your eye. °  °This information is not intended to replace advice given to you by your health care provider. Make sure you discuss any questions you have with your health care provider. °  °Document Released: 03/11/2005 Document Revised: 07/26/2014 Document Reviewed: 03/08/2014 °Elsevier Interactive Patient Education ©2016 Elsevier Inc. °Community Resource Guide Dental °The United Way’s “211” is a great source of information about community services available.  Access by dialing 2-1-1 from anywhere in [place], or by website -  www.nc211.org.  ° °Other Local Resources (Updated 03/2015) ° °Dental  Care °  °Services ° °  °Phone Number and Address  °Cost  °[facility] For children 0 - 21 years of age:  °• Cleaning °• Tooth brushing/flossing instruction °• Sealants, fillings, crowns °• Extractions °• Emergency treatment  [phone] °[address] °[place], [place] [address] Charges based on family income.  Medicaid and some insurance plans accepted.   °  °[facility] [place] • Cleaning °• Sealants, fillings, crowns °• Extractions °• Emergency treatment [phone] °[address] °[place], [place] ° Pregnant women 18 years of age or older with a Medicaid card  °[facility] [place] • Cleaning °• Sealants, fillings, crowns °• Extractions °• Emergency treatment [phone] °[address] °[place], [place] Pregnant   women 18 years of age or older with a Medicaid card  °[facility] For children 0 - 21 years of age:  °• Cleaning °• Tooth brushing/flossing instruction °• Sealants, fillings, crowns °• Extractions °• Emergency treatment °Limited orthodontic services for patients with Medicaid [phone] °[address] °[place], [place] [address] Medicaid and [facility] Health Choice cover for children up to age 21 and pregnant women.  Parents of children up to age 21 without Medicaid pay a reduced fee at time of service.  °[facility] [place] For children 0 - 21 years of age:  °• Cleaning °• Tooth brushing/flossing instruction °• Sealants, fillings, crowns °• Extractions °• Emergency treatment °Limited orthodontic services for patients with Medicaid [phone] °[address] °[place], [place].  Medicaid and [facility] Health Choice cover for children up to age 21 and pregnant women.  Parents of children up to age 21 without Medicaid pay a reduced fee.  °[facility] • Cleaning °• Sealants, fillings, crowns °• Extractions ° °Hours: Tuesdays and Thursdays, 4:15 - 8 pm [phone] °[address] °[place], [place] [address] Services free of charge to [place] residents ages 18-64 who do not have health insurance, Medicare, Medicaid, or VA benefits and fall within federal poverty guidelines  °[facility] ° ° ° Provides dental care in addition to primary medical care, nutritional counseling, and pharmacy: °• Cleaning °• Sealants, fillings, crowns °• Extractions ° ° ° ° ° ° ° ° ° ° ° ° ° ° ° ° ° [phone] °[facility], [address] °[place], [place] ° °[phone] °[facility], [address] [place], [place] ° °[phone] °[facility] °[place], [place] ° °[phone] °[facility], [address] °[place], [place] ° °[phone] °[facility] °[address] °[place], [place] Accepts Medicaid, Medicare, most insurance.  Also provides services available to all with fees adjusted based on ability to pay.    °[facility] • Cleaning °• Tooth brushing/flossing  instruction °• Sealants, fillings, crowns °• Extractions °• Emergency treatment °Hours: Tuesdays, Thursdays, and Fridays from 8 am to 5 pm by appointment only. [phone] °[address] °[place], [place] [address] [place] residents with Medicaid (depending on eligibility) and children with [facility] Health Choice - call for more information.  °[facility] • Extractions only ° °Hours: 2nd and 4th Thursday of each month from 6:30 am - 9 am.   [phone] ext. [phone] °[address] °[place], [place] [address] Ages 18 and older only.  Patients are seen on a first come, first served basis.  °[facility] • Cleanings °• Fillings °• Extractions °• Orthodontics °• Endodontics °• Implants/Crowns/Bridges °• Complete and partial dentures [phone] °[place],  Patients must complete an application for services.  There is often a waiting list.   ° °

## 2015-09-09 NOTE — ED Provider Notes (Signed)
CSN: [id number]     Arrival date & time 09/09/15  1852 History   First MD Initiated Contact with Patient 09/09/15 1917     Chief Complaint  Patient presents with  . Dental Pain     (Consider location/radiation/quality/duration/timing/severity/associated sxs/prior Treatment) Patient is a 25 y.o. male presenting with tooth pain. The history is provided by the patient.  Dental Pain Location:  Upper Upper teeth location:  12/LU 1st bicuspid and 13/LU 2nd bicuspid Quality:  Throbbing and constant Severity:  Severe Onset quality:  Gradual Duration:  2 days Timing:  Constant Progression:  Worsening Chronicity:  New Context: dental caries and poor dentition   Relieved by:  Nothing Worsened by:  Pressure and cold food/drink Ineffective treatments:  Topical anesthetic gel Associated symptoms: facial pain and facial swelling   Risk factors: lack of dental care   Risk factors: no smoking    Jonathon Wright is a 25 y.o. male who present to the ED with dental pain that started 2 days ago and has gotten persistently worse. Patient admits to dental problems in the past and states that his teeth have gotten so bad he just stopped brushing them or doing anything. He has medical insurance but did not op for dental. He reports that then next time he can change his benefits he is going to include dental. Patient has been taking tylenol and ibuprofen without relief. He has also used Or gel.   History reviewed. No pertinent past medical history. Past Surgical History  Procedure Laterality Date  . Wisdom tooth extraction     No family history on file. Social History  Substance Use Topics  . Smoking status: Former Smoker -- 1.00 packs/day for 8 years    Types: Cigarettes    Quit date: 04/29/2015  . Smokeless tobacco: Never Used  . Alcohol Use: No    Review of Systems  HENT: Positive for dental problem and facial swelling.   all other systems negative    Allergies  Review of patient's  allergies indicates no known allergies.  Home Medications   Prior to Admission medications   Medication Sig Start Date End Date Taking? Authorizing Provider  amoxicillin (AMOXIL) 500 MG capsule Take 1 capsule (500 mg total) by mouth 3 (three) times daily. 09/09/15   Hope Orlene Och, NP  cephALEXin (KEFLEX) 500 MG capsule Take 1 capsule (500 mg total) by mouth 4 (four) times daily. 08/25/15   Benjiman Core, MD  HYDROcodone-acetaminophen (NORCO/VICODIN) 5-325 MG tablet Take 1 tablet by mouth every 4 (four) hours as needed. 09/09/15   Hope Orlene Och, NP   BP 127/69 mmHg  Pulse 61  Temp(Src) 98.2 F (36.8 C) (Oral)  Resp 18  Ht  (1.753 m)  Wt 90.266 kg  BMI 29.37 kg/m2  SpO2 100% Physical Exam  Constitutional: He is oriented to person, place, and time. He appears well-developed and well-nourished. No distress.  HENT:  Mouth/Throat: Uvula is midline and oropharynx is clear and moist.    Multiple dental caries, tenderness and swelling to the left upper gum area. Small abscess of gum noted. Gums with erythema.   Eyes: Conjunctivae and EOM are normal.  Neck: Normal range of motion. Neck supple.  Cardiovascular: Normal rate and regular rhythm.   Pulmonary/Chest: Effort normal and breath sounds normal.  Musculoskeletal: Normal range of motion.  Lymphadenopathy:    He has no cervical adenopathy.  Neurological: He is alert and oriented to person, place, and time. No cranial nerve deficit.  Skin:  Skin is warm and dry.  Psychiatric: He has a normal mood and affect. His behavior is normal.  Nursing note and vitals reviewed.   ED Course  Procedures (including critical care time) Labs Review Labs Reviewed - No data to display   MDM  25 y.o. male with dental pain due to multiple caries, abscess and gingivitis. Stable for d/c without fever and does not appear toxic. Will start antibiotics and pain management. Information for dental clinics including [facility].    Final diagnoses:  Dental abscess  Gingivitis       Janne NapoleonHope M Neese, NP 09/10/15 [id number]  Mancel BaleElliott Wentz, MD 09/10/15 2337

## 2015-09-09 NOTE — ED Notes (Signed)
Patient c/o left upper dental pain that started 2 days ago. Denies any fevers. Patient reports taking advil 3 hours ago with no relief.

## 2015-10-31 ENCOUNTER — Emergency Department (HOSPITAL_COMMUNITY)
Admission: EM | Admit: 2015-10-31 | Discharge: 2015-11-01 | Disposition: A | Discharge status: Home / Self Care | Payer: BLUE CROSS/BLUE SHIELD | Attending: Emergency Medicine | Admitting: Emergency Medicine

## 2015-10-31 ENCOUNTER — Emergency Department (HOSPITAL_COMMUNITY): Payer: BLUE CROSS/BLUE SHIELD

## 2015-10-31 ENCOUNTER — Encounter (HOSPITAL_COMMUNITY): Payer: Self-pay | Admitting: *Deleted

## 2015-10-31 DIAGNOSIS — Z7982 Long term (current) use of aspirin: Secondary | ICD-10-CM | POA: Diagnosis not present

## 2015-10-31 DIAGNOSIS — Z87891 Personal history of nicotine dependence: Secondary | ICD-10-CM | POA: Insufficient documentation

## 2015-10-31 DIAGNOSIS — R079 Chest pain, unspecified: Secondary | ICD-10-CM | POA: Diagnosis present

## 2015-10-31 DIAGNOSIS — Z79899 Other long term (current) drug therapy: Secondary | ICD-10-CM | POA: Insufficient documentation

## 2015-10-31 DIAGNOSIS — R0602 Shortness of breath: Secondary | ICD-10-CM | POA: Diagnosis not present

## 2015-10-31 NOTE — ED Triage Notes (Signed)
Pt c/o chest pain that started x 30 mins ago; pt states he has numbness to his left arm

## 2015-11-01 LAB — I-STAT TROPONIN, ED: TROPONIN I, POC: 0 ng/mL (ref 0.00–0.08)

## 2015-11-01 MED ORDER — LORAZEPAM 1 MG PO TABS
ORAL_TABLET | ORAL | Status: AC
  Administered 2015-11-01: 1 mg via ORAL
  Filled 2015-11-01: qty 1

## 2015-11-01 MED ORDER — LORAZEPAM 1 MG PO TABS
1.0000 mg | ORAL_TABLET | Freq: Once | ORAL | Status: AC
  Administered 2015-11-01: 1 mg via ORAL

## 2015-11-01 NOTE — ED Notes (Signed)
Pt alert & oriented x4, stable gait. Patient given discharge instructions, paperwork & prescription(s). Patient  instructed to stop at the registration desk to finish any additional paperwork. Patient verbalized understanding. Pt left department w/ no further questions. 

## 2015-11-01 NOTE — ED Provider Notes (Signed)
[facility] DEPT Provider Note   CSN: [id number] Arrival date & time: 10/31/15  2239  First Provider Contact:  First MD Initiated Contact with Patient 10/31/15 2317        History   Chief Complaint Chief Complaint  Patient presents with  . Chest Pain    HPI Jonathon Wright is a 25 y.o. male.  The history is provided by the patient and a significant other.  Chest Pain   This is a new problem. The current episode started less than 1 hour ago. The problem occurs constantly. The problem has been gradually improving. The pain is present in the substernal region. The pain is moderate. The pain does not radiate. Associated symptoms include numbness and shortness of breath. Pertinent negatives include no abdominal pain, no cough, no fever, no hemoptysis, no lower extremity edema, no syncope and no vomiting. Risk factors include male gender.  Pertinent negatives for past medical history include no CAD, no DVT and no PE.   Pt reports while resting tonight he had onset of CP/SOB He reports feeling anxious and "mind racing" He is starting to improve He reports his left arm felt numb but no focal weakness  PMH = none Takes suboxone daily fam hx - negative premature CAD  Patient Active Problem List   Diagnosis Date Noted  . Right wrist injury 02/01/2015  . Numbness and tingling in right hand 02/01/2015    Past Surgical History:  Procedure Laterality Date  . WISDOM TOOTH EXTRACTION         Home Medications    Prior to Admission medications   Medication Sig Start Date End Date Taking? Authorizing Provider  amoxicillin (AMOXIL) 500 MG capsule Take 1 capsule (500 mg total) by mouth 3 (three) times daily. 09/09/15   Hope Orlene Och, NP  cephALEXin (KEFLEX) 500 MG capsule Take 1 capsule (500 mg total) by mouth 4 (four) times daily. 08/25/15   Benjiman Core, MD  HYDROcodone-acetaminophen (NORCO/VICODIN) 5-325 MG tablet Take 1 tablet by mouth every 4 (four) hours as needed. 09/09/15    Hope Orlene Och, NP    Family History History reviewed. No pertinent family history.  Social History Social History  Substance Use Topics  . Smoking status: Former Smoker    Packs/day: 0.00    Years: 8.00    Quit date: 04/29/2015  . Smokeless tobacco: Never Used  . Alcohol use No     Allergies   Review of patient's allergies indicates no known allergies.   Review of Systems Review of Systems  Constitutional: Negative for fever.  Respiratory: Positive for shortness of breath. Negative for cough and hemoptysis.   Cardiovascular: Positive for chest pain. Negative for syncope.  Gastrointestinal: Negative for abdominal pain and vomiting.  Neurological: Positive for numbness. Negative for syncope.  Psychiatric/Behavioral: The patient is nervous/anxious.   All other systems reviewed and are negative.    Physical Exam Updated Vital Signs BP 107/65   Pulse 62   Temp 97.7 F (36.5 C) (Oral)   Resp 16   Ht  (1.753 m)   Wt 81.6 kg   SpO2 99%   BMI 26.58 kg/m   Physical Exam  CONSTITUTIONAL: Well developed/well nourished, anxious HEAD: Normocephalic/atraumatic EYES: EOMI/PERRL ENMT: Mucous membranes moist NECK: supple no meningeal signs SPINE/BACK:entire spine nontender CV: S1/S2 noted, no murmurs/rubs/gallops noted LUNGS: Lungs are clear to auscultation bilaterally, no apparent distress ABDOMEN: soft, nontender, no rebound or guarding, bowel sounds noted throughout abdomen GU:no cva tenderness NEURO: Pt is awake/alert/appropriate,  moves all extremitiesx4.  No facial droop.  No focal motor weakness in upper extremities. EXTREMITIES: pulses normal/equal, full ROM, no calf tenderness SKIN: warm, color normal PSYCH: anxious  ED Treatments / Results  Labs (all labs ordered are listed, but only abnormal results are displayed) Labs Reviewed  Rose[facility]a Sensor-STAT TROPOININ, ED    EKG  EKG Interpretation  Date/Time:  Tuesday October 31 2015 23:31:30 EDT Ventricular Rate:   46 PR Interval:  162 QRS Duration: 77 QT Interval:  392 QTC Calculation: 343 R Axis:   0 Text Interpretation:  Sinus bradycardia LVH by voltage Borderline T abnormalities, inferior leads No significant change since last tracing Confirmed by Bebe ShaggyWICKLINE  MD, Mayer Vondrak ([id number]) on 10/31/2015 11:37:52 PM       Radiology Dg Chest 2 View  Result Date: 11/01/2015 CLINICAL DATA:  Chest pain for 45 minutes prior to arrival. Central pain, no radiation. EXAM: CHEST  2 VIEW COMPARISON:  05/27/2015 FINDINGS: Lung volumes are low. The cardiomediastinal contours are normal. Pulmonary vasculature is normal. No consolidation, pleural effusion, or pneumothorax. No acute osseous abnormalities are seen. IMPRESSION: Low lung volumes without acute process. Electronically Signed   By: Rubye OaksMelanie  Ehinger M.D.   On: 11/01/2015 00:45    Procedures Procedures (including critical care time)  Medications Ordered in ED Medications  LORazepam (ATIVAN) tablet 1 mg (1 mg Oral Given 11/01/15 0035)     Initial Impression / Assessment and Plan / ED Course  I have reviewed the triage vital signs and the nursing notes.  Pertinent labs & imaging results that were available during my care of the patient were reviewed by me and considered in my medical decision making (see chart for details).  Clinical Course    Pt well appearing but appears anxious He was feeling improved by the time of my evaluation CXR negative EKG unchanged from prior Per protocol, one troponin performed and negative  I doubt ACS/Dissection PERC negative so PE less likely D/c home Discussed strict return precautions  Final Clinical Impressions(s) / ED Diagnoses   Final diagnoses:  Chest pain, unspecified chest pain type    New Prescriptions Discharge Medication List as of 11/01/2015  1:36 AM       Zadie Rhineonald Renika Shiflet, MD 11/01/15 [phone]

## 2015-11-09 ENCOUNTER — Other Ambulatory Visit: Payer: Self-pay | Admitting: Family Medicine

## 2015-11-10 NOTE — Telephone Encounter (Signed)
Not on med list

## 2016-05-20 ENCOUNTER — Emergency Department (HOSPITAL_COMMUNITY): Payer: PRIVATE HEALTH INSURANCE

## 2016-05-20 ENCOUNTER — Emergency Department (HOSPITAL_COMMUNITY)
Admission: EM | Admit: 2016-05-20 | Discharge: 2016-05-20 | Disposition: A | Discharge status: Home / Self Care | Payer: PRIVATE HEALTH INSURANCE | Attending: Emergency Medicine | Admitting: Emergency Medicine

## 2016-05-20 ENCOUNTER — Encounter (HOSPITAL_COMMUNITY): Payer: Self-pay | Admitting: Emergency Medicine

## 2016-05-20 DIAGNOSIS — R05 Cough: Secondary | ICD-10-CM | POA: Diagnosis present

## 2016-05-20 DIAGNOSIS — J111 Influenza due to unidentified influenza virus with other respiratory manifestations: Secondary | ICD-10-CM | POA: Diagnosis not present

## 2016-05-20 DIAGNOSIS — Z87891 Personal history of nicotine dependence: Secondary | ICD-10-CM | POA: Diagnosis not present

## 2016-05-20 MED ORDER — IBUPROFEN 800 MG PO TABS: 800.0000 mg | ORAL_TABLET | Freq: Three times a day (TID) | ORAL | 0 refills | Status: DC | PRN

## 2016-05-20 MED ORDER — OSELTAMIVIR PHOSPHATE 75 MG PO CAPS
75.0000 mg | ORAL_CAPSULE | Freq: Once | ORAL | Status: AC
  Administered 2016-05-20: 75 mg via ORAL
  Filled 2016-05-20: qty 1

## 2016-05-20 MED ORDER — OSELTAMIVIR PHOSPHATE 75 MG PO CAPS: 75.0000 mg | ORAL_CAPSULE | Freq: Two times a day (BID) | ORAL | 0 refills | Status: DC

## 2016-05-20 MED ORDER — ALBUTEROL SULFATE HFA 108 (90 BASE) MCG/ACT IN AERS
2.0000 | INHALATION_SPRAY | RESPIRATORY_TRACT | Status: DC | PRN
  Administered 2016-05-20: 2 via RESPIRATORY_TRACT
  Filled 2016-05-20: qty 6.7

## 2016-05-20 MED ORDER — IBUPROFEN 800 MG PO TABS
800.0000 mg | ORAL_TABLET | Freq: Once | ORAL | Status: AC
  Administered 2016-05-20: 800 mg via ORAL
  Filled 2016-05-20: qty 1

## 2016-05-20 NOTE — Discharge Instructions (Signed)
Drink plenty of fluids.  Take Tylenol for fever.  Follow-up if not improving °

## 2016-05-20 NOTE — ED Provider Notes (Signed)
[facility]-EMERGENCY DEPT Provider Note   CSN: [id number] Arrival date & time: 05/20/16  2035     History   Chief Complaint Chief Complaint  Patient presents with  . URI    HPI Herbie BaltimoreMatthew Riva is a 26 y.o. male.  Patient complains of cough chills aches chest pain   The history is provided by the patient.  URI   This is a new problem. The current episode started yesterday. The problem has not changed since onset.The maximum temperature recorded prior to his arrival was 100 to 100.9 F. Associated symptoms include chest pain. Pertinent negatives include no abdominal pain, no diarrhea, no congestion, no headaches, no cough and no rash. He has tried nothing for the symptoms. The treatment provided no relief.    History reviewed. No pertinent past medical history.  Patient Active Problem List   Diagnosis Date Noted  . Right wrist injury 02/01/2015  . Numbness and tingling in right hand 02/01/2015    Past Surgical History:  Procedure Laterality Date  . WISDOM TOOTH EXTRACTION         Home Medications    Prior to Admission medications   Medication Sig Start Date End Date Taking? Authorizing Provider  amoxicillin (AMOXIL) 500 MG capsule Take 1 capsule (500 mg total) by mouth 3 (three) times daily. 09/09/15   Hope Orlene OchM Neese, NP  cephALEXin (KEFLEX) 500 MG capsule Take 1 capsule (500 mg total) by mouth 4 (four) times daily. 08/25/15   Benjiman CoreNathan Pickering, MD  HYDROcodone-acetaminophen (NORCO/VICODIN) 5-325 MG tablet Take 1 tablet by mouth every 4 (four) hours as needed. 09/09/15   Hope Orlene OchM Neese, NP  ibuprofen (ADVIL,MOTRIN) 800 MG tablet Take 1 tablet (800 mg total) by mouth every 8 (eight) hours as needed for moderate pain. 05/20/16   Bethann BerkshireJoseph Shelva Hetzer, MD  oseltamivir (TAMIFLU) 75 MG capsule Take 1 capsule (75 mg total) by mouth every 12 (twelve) hours. 05/20/16   Bethann BerkshireJoseph Hayden Mabin, MD    Family History No family history on file.  Social History Social History  Substance Use Topics  . Smoking  status: Former Smoker    Packs/day: 0.00    Years: 8.00    Quit date: 04/29/2015  . Smokeless tobacco: Never Used  . Alcohol use No     Allergies   Patient has no known allergies.   Review of Systems Review of Systems  Constitutional: Positive for fatigue and fever. Negative for appetite change.  HENT: Negative for congestion, ear discharge and sinus pressure.   Eyes: Negative for discharge.  Respiratory: Negative for cough.   Cardiovascular: Positive for chest pain.  Gastrointestinal: Negative for abdominal pain and diarrhea.  Genitourinary: Negative for frequency and hematuria.  Musculoskeletal: Negative for back pain.  Skin: Negative for rash.  Neurological: Negative for seizures and headaches.  Psychiatric/Behavioral: Negative for hallucinations.     Physical Exam Updated Vital Signs BP 132/78 (BP Location: Right Arm)   Pulse 72   Temp 97.9 F (36.6 C) (Oral)   Resp 18   Ht 5\' 8"  (1.727 m)   Wt 204 lb (92.5 kg)   SpO2 99%   BMI 31.02 kg/m   Physical Exam  Constitutional: He is oriented to person, place, and time. He appears well-developed.  HENT:  Head: Normocephalic.  Eyes: Conjunctivae and EOM are normal. No scleral icterus.  Neck: Neck supple. No thyromegaly present.  Cardiovascular: Normal rate and regular rhythm.  Exam reveals no gallop and no friction rub.   No murmur heard. Pulmonary/Chest: No stridor.  He has wheezes. He has no rales. He exhibits no tenderness.  Abdominal: He exhibits no distension. There is no tenderness. There is no rebound.  Musculoskeletal: Normal range of motion. He exhibits no edema.  Lymphadenopathy:    He has no cervical adenopathy.  Neurological: He is oriented to person, place, and time. He exhibits normal muscle tone. Coordination normal.  Skin: No rash noted. No erythema.  Psychiatric: He has a normal mood and affect. His behavior is normal.     ED Treatments / Results  Labs (all labs ordered are listed, but only  abnormal results are displayed) Labs Reviewed - No data to display  EKG  EKG Interpretation  Date/Time:  Monday May 20 2016 20:53:40 EST Ventricular Rate:  65 PR Interval:    QRS Duration: 94 QT Interval:  376 QTC Calculation: 391 R Axis:   69 Text Interpretation:  Sinus rhythm Nonspecific T abnormalities, lateral leads Confirmed by Irish Breisch  MD, Melainie Krinsky [phone]) on 05/20/2016 9:33:41 PM       Radiology Dg Chest 2 View  Result Date: 05/20/2016 CLINICAL DATA:  Dyspnea, cough and mid chest pain x2 days EXAM: CHEST  2 VIEW COMPARISON:  Eight hundred seventeen CXR FINDINGS: The heart size and mediastinal contours are within normal limits. Low lung volumes are again noted with minimal atelectasis at the left lung base. No pulmonary consolidation, pulmonary edema nor effusion. No pneumothorax. The visualized skeletal structures are unremarkable. IMPRESSION: No active cardiopulmonary disease. Electronically Signed   By: Tollie Eth M.D.   On: 05/20/2016 21:29    Procedures Procedures (including critical care time)  Medications Ordered in ED Medications  albuterol (PROVENTIL HFA;VENTOLIN HFA) 108 (90 Base) MCG/ACT inhaler 2 puff (not administered)  oseltamivir (TAMIFLU) capsule 75 mg (not administered)  ibuprofen (ADVIL,MOTRIN) tablet 800 mg (not administered)     Initial Impression / Assessment and Plan / ED Course  I have reviewed the triage vital signs and the nursing notes.  Pertinent labs & imaging results that were available during my care of the patient were reviewed by me and considered in my medical decision making (see chart for details).     Patient with influenza. Patient will be treated with Tamiflu Motrin and an albuterol inhaler as needed  Final Clinical Impressions(s) / ED Diagnoses   Final diagnoses:  Influenza    New Prescriptions New Prescriptions   IBUPROFEN (ADVIL,MOTRIN) 800 MG TABLET    Take 1 tablet (800 mg total) by mouth every 8 (eight) hours as  needed for moderate pain.   OSELTAMIVIR (TAMIFLU) 75 MG CAPSULE    Take 1 capsule (75 mg total) by mouth every 12 (twelve) hours.     Bethann Berkshire, MD 05/20/16 2137

## 2016-05-20 NOTE — ED Triage Notes (Signed)
Non productive cough, cold sweats, chills since night before last.  Has been exposed to flu.

## 2016-05-20 NOTE — ED Notes (Signed)
Patient transported to X-ray 

## 2017-04-12 ENCOUNTER — Encounter (HOSPITAL_COMMUNITY): Payer: Self-pay | Admitting: *Deleted

## 2017-04-12 ENCOUNTER — Emergency Department (HOSPITAL_COMMUNITY)
Admission: EM | Admit: 2017-04-12 | Discharge: 2017-04-12 | Disposition: A | Discharge status: Home / Self Care | Payer: Self-pay | Attending: Emergency Medicine | Admitting: Emergency Medicine

## 2017-04-12 ENCOUNTER — Emergency Department (HOSPITAL_COMMUNITY): Payer: Self-pay

## 2017-04-12 ENCOUNTER — Other Ambulatory Visit: Payer: Self-pay

## 2017-04-12 DIAGNOSIS — Z87891 Personal history of nicotine dependence: Secondary | ICD-10-CM | POA: Insufficient documentation

## 2017-04-12 DIAGNOSIS — M5441 Lumbago with sciatica, right side: Secondary | ICD-10-CM | POA: Insufficient documentation

## 2017-04-12 MED ORDER — HYDROCODONE-ACETAMINOPHEN 5-325 MG PO TABS: ORAL_TABLET | ORAL | 0 refills | Status: AC

## 2017-04-12 MED ORDER — OXYCODONE-ACETAMINOPHEN 5-325 MG PO TABS
1.0000 | ORAL_TABLET | Freq: Once | ORAL | Status: AC
  Administered 2017-04-12: 1 via ORAL
  Filled 2017-04-12: qty 1

## 2017-04-12 MED ORDER — CYCLOBENZAPRINE HCL 10 MG PO TABS: 10.0000 mg | ORAL_TABLET | Freq: Three times a day (TID) | ORAL | 0 refills | Status: AC | PRN

## 2017-04-12 MED ORDER — IBUPROFEN 800 MG PO TABS: 800.0000 mg | ORAL_TABLET | Freq: Three times a day (TID) | ORAL | 0 refills | Status: AC
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
## 2017-04-12 NOTE — ED Notes (Signed)
Patient transported to X-ray 
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
## 2017-04-12 NOTE — Discharge Instructions (Signed)
Apply ice packs on and off to your back.  Follow-up with your primary doctor for recheck, return to ER for any worsening symptoms.
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
